# Patient Record
Sex: Female | Born: 1935 | Race: White | Hispanic: No | Smoking: Former smoker
Health system: Southern US, Community
[De-identification: ages and names within clinical notes are randomized; demographics above are authoritative.]

## PROBLEM LIST (undated history)

## (undated) DIAGNOSIS — N200 Calculus of kidney: Secondary | ICD-10-CM

## (undated) DIAGNOSIS — I10 Essential (primary) hypertension: Secondary | ICD-10-CM

## (undated) DIAGNOSIS — E039 Hypothyroidism, unspecified: Secondary | ICD-10-CM

## (undated) DIAGNOSIS — K859 Acute pancreatitis without necrosis or infection, unspecified: Secondary | ICD-10-CM

## (undated) DIAGNOSIS — I739 Peripheral vascular disease, unspecified: Secondary | ICD-10-CM

## (undated) DIAGNOSIS — I5030 Unspecified diastolic (congestive) heart failure: Secondary | ICD-10-CM

## (undated) DIAGNOSIS — I480 Paroxysmal atrial fibrillation: Secondary | ICD-10-CM

## (undated) DIAGNOSIS — M359 Systemic involvement of connective tissue, unspecified: Secondary | ICD-10-CM

## (undated) DIAGNOSIS — I639 Cerebral infarction, unspecified: Secondary | ICD-10-CM

## (undated) DIAGNOSIS — E119 Type 2 diabetes mellitus without complications: Secondary | ICD-10-CM

## (undated) DIAGNOSIS — I4892 Unspecified atrial flutter: Secondary | ICD-10-CM

## (undated) DIAGNOSIS — I219 Acute myocardial infarction, unspecified: Secondary | ICD-10-CM

## (undated) DIAGNOSIS — E7800 Pure hypercholesterolemia, unspecified: Secondary | ICD-10-CM

## (undated) HISTORY — DX: Unspecified atrial flutter: I48.92

## (undated) HISTORY — PX: APPENDECTOMY: SHX54

---

## 1997-05-28 HISTORY — PX: SPLENECTOMY: SUR1306

## 1997-05-28 HISTORY — PX: PARTIAL COLECTOMY: SHX5273

## 1997-05-28 HISTORY — PX: VASCULAR SURGERY: SHX849

## 2010-07-19 ENCOUNTER — Emergency Department (HOSPITAL_BASED_OUTPATIENT_CLINIC_OR_DEPARTMENT_OTHER): Payer: Medicare Other

## 2010-07-19 ENCOUNTER — Emergency Department (HOSPITAL_BASED_OUTPATIENT_CLINIC_OR_DEPARTMENT_OTHER)
Admission: EM | Admit: 2010-07-19 | Discharge: 2010-07-20 | Disposition: A | Discharge status: Home / Self Care | Payer: Medicare Other | Attending: Emergency Medicine | Admitting: Emergency Medicine

## 2010-07-19 DIAGNOSIS — E785 Hyperlipidemia, unspecified: Secondary | ICD-10-CM | POA: Insufficient documentation

## 2010-07-19 DIAGNOSIS — I10 Essential (primary) hypertension: Secondary | ICD-10-CM | POA: Insufficient documentation

## 2010-07-19 DIAGNOSIS — I4460 Unspecified fascicular block: Secondary | ICD-10-CM | POA: Insufficient documentation

## 2010-07-19 DIAGNOSIS — R109 Unspecified abdominal pain: Secondary | ICD-10-CM

## 2010-07-19 DIAGNOSIS — N200 Calculus of kidney: Secondary | ICD-10-CM | POA: Insufficient documentation

## 2010-07-19 DIAGNOSIS — I498 Other specified cardiac arrhythmias: Secondary | ICD-10-CM | POA: Insufficient documentation

## 2010-07-19 DIAGNOSIS — I700 Atherosclerosis of aorta: Secondary | ICD-10-CM | POA: Insufficient documentation

## 2010-07-19 LAB — COMPREHENSIVE METABOLIC PANEL
ALT: 16 U/L (ref 0–35)
AST: 29 U/L (ref 0–37)
Calcium: 10 mg/dL (ref 8.4–10.5)
Creatinine, Ser: 1 mg/dL (ref 0.4–1.2)
GFR calc Af Amer: >60 mL/min (ref >60)
Sodium: 147 mEq/L — ABNORMAL HIGH (ref 135–145)
Total Protein: 7.7 g/dL (ref 6.0–8.3)

## 2010-07-19 LAB — URINALYSIS, ROUTINE W REFLEX MICROSCOPIC
Nitrite: NEGATIVE
Protein, ur: NEGATIVE mg/dL
Specific Gravity, Urine: 1.005 (ref 1.005–1.030)
Urobilinogen, UA: 0.2 mg/dL (ref 0.0–1.0)

## 2010-07-19 LAB — DIFFERENTIAL
Basophils Absolute: 0 10*3/uL (ref 0.0–0.1)
Basophils Relative: 0 % (ref 0–1)
Eosinophils Absolute: 0.1 10*3/uL (ref 0.0–0.7)
Eosinophils Relative: 1 % (ref 0–5)
Lymphocytes Relative: 34 % (ref 12–46)

## 2010-07-19 LAB — CBC
Platelets: 184 10*3/uL (ref 150–400)
RDW: 14.2 % (ref 11.5–15.5)
WBC: 10.7 10*3/uL — ABNORMAL HIGH (ref 4.0–10.5)

## 2010-07-19 LAB — ETHANOL: Alcohol, Ethyl (B): 70 mg/dL — ABNORMAL HIGH (ref 0–10)

## 2010-07-20 MED ORDER — IOHEXOL 300 MG/ML  SOLN
100.0000 mL | Freq: Once | INTRAMUSCULAR | Status: AC | PRN
  Administered 2010-07-20: 100 mL via INTRAVENOUS

## 2010-07-21 ENCOUNTER — Emergency Department (INDEPENDENT_AMBULATORY_CARE_PROVIDER_SITE_OTHER): Payer: Medicare Other

## 2010-07-21 ENCOUNTER — Emergency Department (HOSPITAL_BASED_OUTPATIENT_CLINIC_OR_DEPARTMENT_OTHER)
Admission: EM | Admit: 2010-07-21 | Discharge: 2010-07-22 | Disposition: A | Discharge status: Home / Self Care | Payer: Medicare Other | Attending: Emergency Medicine | Admitting: Emergency Medicine

## 2010-07-21 DIAGNOSIS — R110 Nausea: Secondary | ICD-10-CM | POA: Insufficient documentation

## 2010-07-21 DIAGNOSIS — K117 Disturbances of salivary secretion: Secondary | ICD-10-CM | POA: Insufficient documentation

## 2010-07-21 DIAGNOSIS — K859 Acute pancreatitis without necrosis or infection, unspecified: Secondary | ICD-10-CM | POA: Insufficient documentation

## 2010-07-21 DIAGNOSIS — R109 Unspecified abdominal pain: Secondary | ICD-10-CM

## 2010-07-21 DIAGNOSIS — J984 Other disorders of lung: Secondary | ICD-10-CM

## 2010-07-21 DIAGNOSIS — I517 Cardiomegaly: Secondary | ICD-10-CM | POA: Insufficient documentation

## 2010-07-21 DIAGNOSIS — E785 Hyperlipidemia, unspecified: Secondary | ICD-10-CM | POA: Insufficient documentation

## 2010-07-21 DIAGNOSIS — I10 Essential (primary) hypertension: Secondary | ICD-10-CM | POA: Insufficient documentation

## 2010-07-21 LAB — CBC
HCT: 31 % — ABNORMAL LOW (ref 36.0–46.0)
Hemoglobin: 10.9 g/dL — ABNORMAL LOW (ref 12.0–15.0)
MCHC: 35.2 g/dL (ref 30.0–36.0)
RBC: 3.31 MIL/uL — ABNORMAL LOW (ref 3.87–5.11)

## 2010-07-21 LAB — COMPREHENSIVE METABOLIC PANEL
ALT: 23 U/L (ref 0–35)
AST: 29 U/L (ref 0–37)
Albumin: 4.2 g/dL (ref 3.5–5.2)
Alkaline Phosphatase: 82 U/L (ref 39–117)
CO2: 27 mEq/L (ref 19–32)
Chloride: 105 mEq/L (ref 96–112)
Creatinine, Ser: 1.1 mg/dL (ref 0.4–1.2)
GFR calc Af Amer: 58 mL/min — ABNORMAL LOW (ref >60)
GFR calc non Af Amer: 48 mL/min — ABNORMAL LOW (ref >60)
Potassium: 3.6 mEq/L (ref 3.5–5.1)
Sodium: 146 mEq/L — ABNORMAL HIGH (ref 135–145)
Total Bilirubin: 0.7 mg/dL (ref 0.3–1.2)

## 2010-07-21 LAB — DIFFERENTIAL
Basophils Absolute: 0 10*3/uL (ref 0.0–0.1)
Basophils Relative: 0 % (ref 0–1)
Lymphocytes Relative: 24 % (ref 12–46)
Monocytes Absolute: 0.8 10*3/uL (ref 0.1–1.0)
Neutro Abs: 7.7 10*3/uL (ref 1.7–7.7)
Neutrophils Relative %: 68 % (ref 43–77)

## 2010-07-21 LAB — LACTIC ACID, PLASMA: Lactic Acid, Venous: 1.3 mmol/L (ref 0.5–2.2)

## 2011-07-04 ENCOUNTER — Other Ambulatory Visit: Payer: Self-pay | Admitting: Physician Assistant

## 2011-10-08 ENCOUNTER — Other Ambulatory Visit: Payer: Self-pay | Admitting: Physician Assistant

## 2011-11-13 ENCOUNTER — Other Ambulatory Visit: Payer: Self-pay | Admitting: Physician Assistant

## 2012-02-09 ENCOUNTER — Other Ambulatory Visit: Payer: Self-pay | Admitting: Physician Assistant

## 2012-02-11 ENCOUNTER — Other Ambulatory Visit: Payer: Self-pay | Admitting: Physician Assistant

## 2012-02-11 NOTE — Telephone Encounter (Signed)
This is not a patient here at [facility]. °

## 2012-02-23 ENCOUNTER — Other Ambulatory Visit: Payer: Self-pay | Admitting: Physician Assistant

## 2012-02-23 NOTE — Telephone Encounter (Signed)
Please pull paper chart.  

## 2012-02-24 NOTE — Telephone Encounter (Signed)
No paper chart °

## 2012-03-08 ENCOUNTER — Other Ambulatory Visit: Payer: Self-pay | Admitting: Physician Assistant

## 2012-03-10 NOTE — Telephone Encounter (Signed)
This is not a patient here at [facility]. °

## 2012-05-02 ENCOUNTER — Other Ambulatory Visit: Payer: Self-pay | Admitting: Physician Assistant

## 2012-05-02 NOTE — Telephone Encounter (Signed)
Needs OV /labs

## 2013-02-02 ENCOUNTER — Emergency Department (HOSPITAL_BASED_OUTPATIENT_CLINIC_OR_DEPARTMENT_OTHER): Payer: Medicare Other

## 2013-02-02 ENCOUNTER — Other Ambulatory Visit (HOSPITAL_BASED_OUTPATIENT_CLINIC_OR_DEPARTMENT_OTHER): Payer: Medicare Other

## 2013-02-02 ENCOUNTER — Encounter (HOSPITAL_BASED_OUTPATIENT_CLINIC_OR_DEPARTMENT_OTHER): Payer: Self-pay

## 2013-02-02 ENCOUNTER — Inpatient Hospital Stay (HOSPITAL_BASED_OUTPATIENT_CLINIC_OR_DEPARTMENT_OTHER)
Admission: EM | Admit: 2013-02-02 | Discharge: 2013-02-06 | DRG: 308 | Disposition: A | Discharge status: Home / Self Care | Payer: Medicare Other | Attending: Internal Medicine | Admitting: Internal Medicine

## 2013-02-02 DIAGNOSIS — E119 Type 2 diabetes mellitus without complications: Secondary | ICD-10-CM | POA: Diagnosis present

## 2013-02-02 DIAGNOSIS — I4891 Unspecified atrial fibrillation: Secondary | ICD-10-CM

## 2013-02-02 DIAGNOSIS — I10 Essential (primary) hypertension: Secondary | ICD-10-CM | POA: Diagnosis present

## 2013-02-02 DIAGNOSIS — E7800 Pure hypercholesterolemia, unspecified: Secondary | ICD-10-CM

## 2013-02-02 DIAGNOSIS — I700 Atherosclerosis of aorta: Secondary | ICD-10-CM | POA: Diagnosis present

## 2013-02-02 DIAGNOSIS — E876 Hypokalemia: Secondary | ICD-10-CM | POA: Diagnosis not present

## 2013-02-02 DIAGNOSIS — I7090 Unspecified atherosclerosis: Secondary | ICD-10-CM

## 2013-02-02 DIAGNOSIS — I509 Heart failure, unspecified: Secondary | ICD-10-CM | POA: Diagnosis not present

## 2013-02-02 DIAGNOSIS — I252 Old myocardial infarction: Secondary | ICD-10-CM

## 2013-02-02 DIAGNOSIS — Z7982 Long term (current) use of aspirin: Secondary | ICD-10-CM

## 2013-02-02 DIAGNOSIS — I5031 Acute diastolic (congestive) heart failure: Secondary | ICD-10-CM

## 2013-02-02 DIAGNOSIS — E785 Hyperlipidemia, unspecified: Secondary | ICD-10-CM | POA: Diagnosis present

## 2013-02-02 DIAGNOSIS — Z79899 Other long term (current) drug therapy: Secondary | ICD-10-CM

## 2013-02-02 DIAGNOSIS — I739 Peripheral vascular disease, unspecified: Secondary | ICD-10-CM | POA: Diagnosis present

## 2013-02-02 DIAGNOSIS — F17200 Nicotine dependence, unspecified, uncomplicated: Secondary | ICD-10-CM | POA: Diagnosis present

## 2013-02-02 HISTORY — DX: Acute pancreatitis without necrosis or infection, unspecified: K85.90

## 2013-02-02 HISTORY — DX: Systemic involvement of connective tissue, unspecified: M35.9

## 2013-02-02 HISTORY — DX: Paroxysmal atrial fibrillation: I48.0

## 2013-02-02 HISTORY — DX: Acute myocardial infarction, unspecified: I21.9

## 2013-02-02 HISTORY — DX: Type 2 diabetes mellitus without complications: E11.9

## 2013-02-02 HISTORY — DX: Pure hypercholesterolemia, unspecified: E78.00

## 2013-02-02 LAB — COMPREHENSIVE METABOLIC PANEL
ALT: 19 U/L (ref 0–35)
Alkaline Phosphatase: 90 U/L (ref 39–117)
CO2: 27 mEq/L (ref 19–32)
Chloride: 99 mEq/L (ref 96–112)
GFR calc Af Amer: 43 mL/min — ABNORMAL LOW (ref >90)
Glucose, Bld: 112 mg/dL — ABNORMAL HIGH (ref 70–99)
Potassium: 3.8 mEq/L (ref 3.5–5.1)
Sodium: 139 mEq/L (ref 135–145)
Total Protein: 7.1 g/dL (ref 6.0–8.3)

## 2013-02-02 LAB — CBC WITH DIFFERENTIAL/PLATELET
Eosinophils Absolute: 0.1 10*3/uL (ref 0.0–0.7)
Lymphocytes Relative: 30 % (ref 12–46)
Lymphs Abs: 2.8 10*3/uL (ref 0.7–4.0)
Neutro Abs: 5.6 10*3/uL (ref 1.7–7.7)
Neutrophils Relative %: 60 % (ref 43–77)
Platelets: 192 10*3/uL (ref 150–400)

## 2013-02-02 LAB — TSH: TSH: 2.435 u[IU]/mL (ref 0.350–4.500)

## 2013-02-02 LAB — TROPONIN I: Troponin I: <0.3 ng/mL (ref <0.30)

## 2013-02-02 MED ORDER — DILTIAZEM HCL 100 MG IV SOLR
5.0000 mg/h | INTRAVENOUS | Status: DC
  Administered 2013-02-02: 5 mg/h via INTRAVENOUS
  Administered 2013-02-02: 10 mg/h via INTRAVENOUS
  Administered 2013-02-03: 5 mg/h via INTRAVENOUS
  Administered 2013-02-04: 10 mg/h via INTRAVENOUS

## 2013-02-02 MED ORDER — SODIUM CHLORIDE 0.9 % IV SOLN
INTRAVENOUS | Status: AC
  Administered 2013-02-02 – 2013-02-03 (×2): via INTRAVENOUS

## 2013-02-02 MED ORDER — LEVOTHYROXINE SODIUM 88 MCG PO CAPS: 88.0000 ug | ORAL_CAPSULE | Freq: Every day | ORAL | Status: DC

## 2013-02-02 MED ORDER — SIMVASTATIN 40 MG PO TABS
40.0000 mg | ORAL_TABLET | Freq: Every day | ORAL | Status: DC
  Administered 2013-02-03: 40 mg via ORAL
  Filled 2013-02-02 (×2): qty 1

## 2013-02-02 MED ORDER — ONDANSETRON HCL 4 MG/2ML IJ SOLN: 4.0000 mg | Freq: Four times a day (QID) | INTRAMUSCULAR | Status: DC | PRN

## 2013-02-02 MED ORDER — ACETAMINOPHEN 325 MG PO TABS: 650.0000 mg | ORAL_TABLET | ORAL | Status: DC | PRN

## 2013-02-02 MED ORDER — ASPIRIN 81 MG PO CHEW
324.0000 mg | CHEWABLE_TABLET | ORAL | Status: AC
  Administered 2013-02-02: 243 mg via ORAL
  Filled 2013-02-02: qty 3

## 2013-02-02 MED ORDER — LEVOTHYROXINE SODIUM 88 MCG PO TABS
88.0000 ug | ORAL_TABLET | Freq: Every day | ORAL | Status: DC
  Administered 2013-02-03 – 2013-02-06 (×3): 88 ug via ORAL
  Filled 2013-02-02 (×5): qty 1

## 2013-02-02 MED ORDER — GABAPENTIN 100 MG PO CAPS
500.0000 mg | ORAL_CAPSULE | Freq: Every day | ORAL | Status: DC
  Administered 2013-02-02 – 2013-02-05 (×4): 500 mg via ORAL
  Filled 2013-02-02 (×5): qty 5

## 2013-02-02 MED ORDER — OMEGA-3 FATTY ACIDS 1000 MG PO CAPS: 1.0000 g | ORAL_CAPSULE | Freq: Every day | ORAL | Status: DC

## 2013-02-02 MED ORDER — TRAMADOL HCL 50 MG PO TABS
50.0000 mg | ORAL_TABLET | Freq: Four times a day (QID) | ORAL | Status: DC | PRN
  Administered 2013-02-03 – 2013-02-06 (×2): 50 mg via ORAL
  Filled 2013-02-02 (×2): qty 1

## 2013-02-02 MED ORDER — HEPARIN (PORCINE) IN NACL 100-0.45 UNIT/ML-% IJ SOLN
INTRAMUSCULAR | Status: AC
  Filled 2013-02-02: qty 250

## 2013-02-02 MED ORDER — DILTIAZEM HCL 100 MG IV SOLR
INTRAVENOUS | Status: AC
  Filled 2013-02-02: qty 100

## 2013-02-02 MED ORDER — MESALAMINE ER 0.375 G PO CP24
0.7500 g | ORAL_CAPSULE | Freq: Every day | ORAL | Status: DC
  Administered 2013-02-03 – 2013-02-06 (×3): 0.75 g via ORAL

## 2013-02-02 MED ORDER — HEPARIN (PORCINE) IN NACL 100-0.45 UNIT/ML-% IJ SOLN
950.0000 [IU]/h | INTRAMUSCULAR | Status: DC
  Administered 2013-02-02 (×2): 650 [IU]/h via INTRAVENOUS
  Administered 2013-02-04: 900 [IU]/h via INTRAVENOUS
  Administered 2013-02-06: 950 [IU]/h via INTRAVENOUS
  Filled 2013-02-02 (×7): qty 250

## 2013-02-02 MED ORDER — DILTIAZEM HCL 25 MG/5ML IV SOLN
INTRAVENOUS | Status: AC
  Filled 2013-02-02: qty 5

## 2013-02-02 MED ORDER — SODIUM CHLORIDE 0.9 % IV SOLN: INTRAVENOUS | Status: AC

## 2013-02-02 MED ORDER — ISOSORBIDE MONONITRATE ER 60 MG PO TB24
60.0000 mg | ORAL_TABLET | Freq: Every day | ORAL | Status: DC
  Administered 2013-02-03: 60 mg via ORAL
  Filled 2013-02-02 (×2): qty 1

## 2013-02-02 MED ORDER — DIGOXIN 0.25 MG/ML IJ SOLN
0.5000 mg | Freq: Once | INTRAMUSCULAR | Status: AC
  Administered 2013-02-02: 0.5 mg via INTRAVENOUS
  Filled 2013-02-02: qty 2

## 2013-02-02 MED ORDER — HEPARIN (PORCINE) IN NACL 100-0.45 UNIT/ML-% IJ SOLN
12.0000 [IU]/kg/h | INTRAMUSCULAR | Status: DC
  Administered 2013-02-02: 12 [IU]/kg/h via INTRAVENOUS

## 2013-02-02 MED ORDER — NITROGLYCERIN 0.4 MG SL SUBL: 0.4000 mg | SUBLINGUAL_TABLET | SUBLINGUAL | Status: DC | PRN

## 2013-02-02 MED ORDER — ASPIRIN EC 81 MG PO TBEC
81.0000 mg | DELAYED_RELEASE_TABLET | Freq: Every day | ORAL | Status: DC
  Administered 2013-02-03 – 2013-02-06 (×3): 81 mg via ORAL
  Filled 2013-02-02 (×4): qty 1

## 2013-02-02 MED ORDER — OMEGA-3-ACID ETHYL ESTERS 1 G PO CAPS
1.0000 g | ORAL_CAPSULE | Freq: Every day | ORAL | Status: DC
  Administered 2013-02-03 – 2013-02-06 (×3): 1 g via ORAL
  Filled 2013-02-02 (×4): qty 1

## 2013-02-02 MED ORDER — ASPIRIN 300 MG RE SUPP
300.0000 mg | RECTAL | Status: AC
  Filled 2013-02-02: qty 1

## 2013-02-02 MED ORDER — DILTIAZEM LOAD VIA INFUSION
10.0000 mg | Freq: Once | INTRAVENOUS | Status: AC
  Administered 2013-02-02: 10 mg via INTRAVENOUS
  Filled 2013-02-02: qty 10

## 2013-02-02 MED ORDER — SODIUM CHLORIDE 0.9 % IV SOLN
INTRAVENOUS | Status: DC
  Administered 2013-02-02: 17:00:00 via INTRAVENOUS

## 2013-02-02 NOTE — ED Provider Notes (Signed)
CSN: [id number]     Arrival date & time 02/02/13  1601 History  This chart was scribed for Gerhard Munch, MD by Arlan Organ, ED Scribe and Greggory Stallion, ED Scribe. This patient was seen in room MH10/MH10 and the patient's care was started at 4:37 PM.    Chief Complaint  Patient presents with  . Shortness of Breath   The history is provided by the patient. No language interpreter was used.   HPI Comments: Salvatrice Morandi is a 79 y.o. female who presents to the Emergency Department complaining of SOB which started 2 days ago. Pt states she is having minor pain in her chest which only occurs after she ambulates. Pt denies confusion, disorientation, swellling, emesis, diarrhea. Pt had a heart attack 6-7 years ago and had vascular surgery in the late 1990's. She is currently on Plavix. Pt occasionally smokes cigarettes.   cardiologist is Dr. Harriette Bouillon PCP is Dr. Virl Son [facility].  Past Medical History  Diagnosis Date  . Pancreatitis   . Hypotension   . Myocardial infarction   . Thyroid disease   . High cholesterol    Past Surgical History  Procedure Laterality Date  . Vascular surgery    . Appendectomy     No family history on file. History  Substance Use Topics  . Smoking status: Current Some Day Smoker  . Smokeless tobacco: Not on file  . Alcohol Use: No   OB History   Grav Para Term Preterm Abortions TAB SAB Ect Mult Living                 Review of Systems  Constitutional:       Per HPI, otherwise negative  HENT:       Per HPI, otherwise negative  Respiratory:       Per HPI, otherwise negative  Cardiovascular:       Per HPI, otherwise negative  Gastrointestinal: Negative for vomiting.  Endocrine:       Negative aside from HPI  Genitourinary:       Neg aside from HPI   Musculoskeletal:       Per HPI, otherwise negative  Skin: Negative.   Neurological: Negative for syncope.  All other systems reviewed and are negative.    Allergies   Biaxin  Home Medications   Current Outpatient Rx  Name  Route  Sig  Dispense  Refill  . aspirin 81 MG tablet   Oral   Take 81 mg by mouth daily.         . Calcium Carbonate (CALCIUM 600 PO)   Oral   Take by mouth.         . carvedilol (COREG) 3.125 MG tablet   Oral   Take 3.125 mg by mouth 2 (two) times daily with a meal.         . Echinacea 400 MG CAPS   Oral   Take by mouth.         . fish oil-omega-3 fatty acids 1000 MG capsule   Oral   Take 1 g by mouth daily.         Marland Kitchen gabapentin (NEURONTIN) 300 MG capsule   Oral   Take 300 mg by mouth 3 (three) times daily.         . isosorbide mononitrate (IMDUR) 60 MG 24 hr tablet   Oral   Take 60 mg by mouth daily.         . Levothyroxine Sodium 88 MCG  CAPS   Oral   Take by mouth daily before breakfast.         . mesalamine (APRISO) 0.375 G 24 hr capsule   Oral   Take 375 mg by mouth daily.         . traMADol (ULTRAM) 50 MG tablet   Oral   Take 50 mg by mouth every 6 (six) hours as needed for pain.         . simvastatin (ZOCOR) 40 MG tablet      TAKE 1 TABLET DAILY   30 tablet   0     Needs office visit/labs!    BP 90/70  Pulse 146  Temp(Src) 98 F (36.7 C) (Oral)  Resp 12  SpO2 94% Physical Exam  Nursing note and vitals reviewed. Constitutional: She is oriented to person, place, and time. She appears well-developed and well-nourished. No distress.  HENT:  Head: Normocephalic and atraumatic.  Eyes: Conjunctivae and EOM are normal.  Cardiovascular:  Tachycardia and atrial fibrilation  Pulmonary/Chest: Effort normal and breath sounds normal. No stridor. No respiratory distress.  Abdominal: She exhibits no distension.  Musculoskeletal: She exhibits no edema.  Neurological: She is alert and oriented to person, place, and time. No cranial nerve deficit.  Skin: Skin is warm and dry.  Psychiatric: She has a normal mood and affect.    ED Course  Procedures (including critical care  time)  DIAGNOSTIC STUDIES: Oxygen Saturation is 94% on RA, Adequate by my interpretation.    COORDINATION OF CARE: 4:33 PM- Will admit pt to [facility] and will be prescribed medication for aFib. Pt agreed to plan.  Labs Review Labs Reviewed - No data to display Imaging Review No results found.  With the patient's irregular heart rhythm she received empiric therapy with fluids, diltiazem.  Update: Patient has a heart rate 149, tachycardic, abnormal   EKG has atrial fibrillation with rapid ventricular response, rate 128, with a bifascicular block, right bundle, left anterior block.  This is similar to the EKG she had performed prior to arrival. This is abnormal.    Update: Patient now has heart rate 130s, blood pressure decreased, with systolic, 90.   Update: I discussed the patient's case with our cardiologist, Dr. Tenny Craw.  Patient will receive digoxin, heparin. On repeat exam the patient appears in similar condition, with heart rate 120s, labile blood pressure. Patient and her son are aware of labs, transfer.    MDM  No diagnosis found.  I personally performed the services described in this documentation, which was scribed in my presence. The recorded information has been reviewed and is accurate.   This patient presents with dyspnea, mild chest pain.  On exam she is awake and alert, but she is immediately notable abnormal vital signs with heart rate greater than 140.  Patient has no history of prior dysrhythmia, though she does have known vasculopathy, takes Plavix. With her new atrial fibrillation with rapid ventricular response she received initial resuscitation with diltiazem.  Diltiazem therapy resulted in decreased heart rate, but blood pressure decreased as well.  After discussion with our cardiologist, the patient received digoxin, heparin. With her labile blood pressure, persistent tachycardia, she required transfer for further evaluation and management.  Patient will  go to step down unit.  CRITICAL CARE Performed by: Gerhard Munch Total critical care time: 40 Critical care time was exclusive of separately billable procedures and treating other patients. Critical care was necessary to treat or prevent imminent or life-threatening deterioration. Critical care  was time spent personally by me on the following activities: development of treatment plan with patient and/or surrogate as well as nursing, discussions with consultants, evaluation of patient's response to treatment, examination of patient, obtaining history from patient or surrogate, ordering and performing treatments and interventions, ordering and review of laboratory studies, ordering and review of radiographic studies, pulse oximetry and re-evaluation of patient's condition.   Gerhard Munch, MD 02/02/13 [phone]

## 2013-02-02 NOTE — Progress Notes (Signed)
ANTICOAGULATION CONSULT NOTE - Initial Consult  Pharmacy Consult for Heparin Indication: chest pain/ACS  Allergies  Allergen Reactions  . Biaxin [Clarithromycin] Rash    Patient Measurements: Weight: 109 lb (49.442 kg) Heparin Dosing Weight: 49  Vital Signs: Temp: 98.3 F (36.8 C) (09/08 1851) Temp src: Oral (09/08 1851) BP: 96/69 mmHg (09/08 1851) Pulse Rate: 127 (09/08 1851)  Labs:  Recent Labs  02/02/13 1636  HGB 10.7*  HCT 31.8*  PLT 192  LABPROT 14.6  INR 1.16  CREATININE 1.30*  TROPONINI <0.30    CrCl is unknown because there is no height on file for the current visit.   Medical History: Past Medical History  Diagnosis Date  . Pancreatitis   . Hypotension   . Myocardial infarction   . Thyroid disease   . High cholesterol     Medications:  Prescriptions prior to admission  Medication Sig Dispense Refill  . aspirin 81 MG tablet Take 81 mg by mouth daily.      . Calcium Carbonate (CALCIUM 600 PO) Take 1 tablet by mouth daily.       . carvedilol (COREG) 3.125 MG tablet Take 3.125 mg by mouth 2 (two) times daily with a meal.      . Echinacea 400 MG CAPS Take 1 capsule by mouth daily.       . fish oil-omega-3 fatty acids 1000 MG capsule Take 1 g by mouth daily.      Marland Kitchen gabapentin (NEURONTIN) 300 MG capsule Take 300 mg by mouth See admin instructions.       . isosorbide mononitrate (IMDUR) 60 MG 24 hr tablet Take 60 mg by mouth daily.      . Levothyroxine Sodium 88 MCG CAPS Take by mouth daily before breakfast.      . mesalamine (APRISO) 0.375 G 24 hr capsule Take 375 mg by mouth daily.      . simvastatin (ZOCOR) 40 MG tablet TAKE 1 TABLET DAILY  30 tablet  0  . traMADol (ULTRAM) 50 MG tablet Take 50 mg by mouth every 6 (six) hours as needed for pain.        Assessment: 79 yo M admitted 02/02/2013 with SOB.  Pharmacy consulted to dose heparin for new atrial fibrillation.  PMH: CAD, pancreatitis, hypothyroid  Goal of Therapy:  Heparin level 0.3-0.7  units/ml Monitor platelets by anticoagulation protocol: Yes   Plan:  Continue heparin at 650 units/hr, started at 1900 9.8 Check heparin level ~8h after ggt started, with AML Daily heparin level and CBC   Thank you for allowing pharmacy to be a part of this patients care team.  Lovenia Kim Pharm.D., BCPS Clinical Pharmacist 02/02/2013 9:08 PM Pager: [phone] Phone: [phone]

## 2013-02-02 NOTE — ED Notes (Addendum)
Was sent from [facility]CP[facility] with ?new onset Afib-pt c/o DOE and palpitations-Dr Leavy Cella called PTA reports pt with CP, DOE-EKG at office with AFIB, no old EKG and no mention in old cards reports of Afib-pt refused EMS transport-son to bring pt-pt into triage NAD with son-steady gait-no resp distress-denies pain at present

## 2013-02-02 NOTE — H&P (Signed)
Cardiology History and Physical  Verlon Au, MD  History of Present Illness (and review of medical records): Sydney Quinn is a 79 y.o. female who presents for evaluation of dyspnea on exertion.  She reports the development of dyspnea on exertion after awakening Saturday morning.  This lasted 3-4 mins and resolved with rest.  She would notice return of similar symptoms with mild exertion over the weekend which is usual for her.  She normal exercises 3-4x /week with no problems.  She then noticed her heart racing on Sunday.  She had one episode of 4-5/10 midsternal chest pain that resolved spontaneously.  She saw her PCP today and was found to be in AFib with RVR.  She was transferred for further evaluation and management.  Review of Systems Further review of systems was otherwise negative other than stated in HPI.  There are no active problems to display for this patient.  Past Medical History  Diagnosis Date  . Pancreatitis   . Hypotension   . Myocardial infarction   . Thyroid disease   . High cholesterol     Past Surgical History  Procedure Laterality Date  . Vascular surgery    . Appendectomy      Prescriptions prior to admission  Medication Sig Dispense Refill  . aspirin 81 MG tablet Take 81 mg by mouth daily.      . Calcium Carbonate (CALCIUM 600 PO) Take 1 tablet by mouth daily.       . carvedilol (COREG) 3.125 MG tablet Take 3.125 mg by mouth 2 (two) times daily with a meal.      . Echinacea 400 MG CAPS Take 1 capsule by mouth daily.       . fish oil-omega-3 fatty acids 1000 MG capsule Take 1 g by mouth daily.      Marland Kitchen gabapentin (NEURONTIN) 300 MG capsule Take 300 mg by mouth See admin instructions.       . isosorbide mononitrate (IMDUR) 60 MG 24 hr tablet Take 60 mg by mouth daily.      . Levothyroxine Sodium 88 MCG CAPS Take by mouth daily before breakfast.      . mesalamine (APRISO) 0.375 G 24 hr capsule Take 375 mg by mouth daily.      . simvastatin (ZOCOR)  40 MG tablet TAKE 1 TABLET DAILY  30 tablet  0  . traMADol (ULTRAM) 50 MG tablet Take 50 mg by mouth every 6 (six) hours as needed for pain.       Allergies  Allergen Reactions  . Biaxin [Clarithromycin] Rash    History  Substance Use Topics  . Smoking status: Current Some Day Smoker  . Smokeless tobacco: Not on file  . Alcohol Use: No    No family history on file.   Objective:  Patient Vitals for the past 8 hrs:  BP Temp Temp src Pulse Resp SpO2 Weight  02/02/13 1942 - - - - - - 49.442 kg (109 lb)  02/02/13 1851 96/69 mmHg 98.3 F (36.8 C) Oral 127 18 93 % -  02/02/13 1743 102/66 mmHg - - 130 - - -  02/02/13 1722 92/77 mmHg - - 129 - - -  02/02/13 1655 107/80 mmHg - - 144 14 96 % -  02/02/13 1615 90/70 mmHg 98 F (36.7 C) Oral 146 12 94 % -   General appearance: alert, cooperative, appears stated age and no distress Head: Normocephalic, without obvious abnormality, atraumatic Eyes: conjunctivae/corneas clear. PERRL, EOM's intact. Fundi benign.  Neck: bilateral carotid bruits, no JVD and supple Lungs: clear to auscultation bilaterally Chest wall: no tenderness Heart: irregular rate and rhythm, S1, S2 normal Abdomen: soft, non-tender; bowel sounds normal; no masses,  no organomegaly Extremities: extremities normal, atraumatic, no cyanosis or edema Pulses: 2+ and symmetric Neurologic: Grossly normal  Results for orders placed during the hospital encounter of 02/02/13 (from the past 48 hour(s))  TROPONIN I     Status: None   Collection Time    02/02/13  4:36 PM      Result Value Range   Troponin I <0.30  <0.30 ng/mL   Comment:            Due to the release kinetics of cTnI,     a negative result within the first hours     of the onset of symptoms does not rule out     myocardial infarction with certainty.     If myocardial infarction is still suspected,     repeat the test at appropriate intervals.  CBC WITH DIFFERENTIAL     Status: Abnormal   Collection Time     02/02/13  4:36 PM      Result Value Range   WBC 9.3  4.0 - 10.5 K/uL   RBC 3.33 (*) 3.87 - 5.11 MIL/uL   Hemoglobin 10.7 (*) 12.0 - 15.0 g/dL   HCT 81.1 (*) 91.4 - 78.2 %   MCV 95.5  78.0 - 100.0 fL   MCH 32.1  26.0 - 34.0 pg   MCHC 33.6  30.0 - 36.0 g/dL   RDW 95.6  21.3 - 08.6 %   Platelets 192  150 - 400 K/uL   Neutrophils Relative % 60  43 - 77 %   Neutro Abs 5.6  1.7 - 7.7 K/uL   Lymphocytes Relative 30  12 - 46 %   Lymphs Abs 2.8  0.7 - 4.0 K/uL   Monocytes Relative 8  3 - 12 %   Monocytes Absolute 0.8  0.1 - 1.0 K/uL   Eosinophils Relative 2  0 - 5 %   Eosinophils Absolute 0.1  0.0 - 0.7 K/uL   Basophils Relative 0  0 - 1 %   Basophils Absolute 0.0  0.0 - 0.1 K/uL  COMPREHENSIVE METABOLIC PANEL     Status: Abnormal   Collection Time    02/02/13  4:36 PM      Result Value Range   Sodium 139  135 - 145 mEq/L   Potassium 3.8  3.5 - 5.1 mEq/L   Chloride 99  96 - 112 mEq/L   CO2 27  19 - 32 mEq/L   Glucose, Bld 112 (*) 70 - 99 mg/dL   BUN 24 (*) 6 - 23 mg/dL   Creatinine, Ser 5.78 (*) 0.50 - 1.10 mg/dL   Calcium 46.9  8.4 - 62.9 mg/dL   Total Protein 7.1  6.0 - 8.3 g/dL   Albumin 3.3 (*) 3.5 - 5.2 g/dL   AST 26  0 - 37 U/L   ALT 19  0 - 35 U/L   Alkaline Phosphatase 90  39 - 117 U/L   Total Bilirubin 0.6  0.3 - 1.2 mg/dL   GFR calc non Af Amer 37 (*) >90 mL/min   GFR calc Af Amer 43 (*) >90 mL/min   Comment: (NOTE)     The eGFR has been calculated using the CKD EPI equation.     This calculation has not been validated in all clinical  situations.     eGFR's persistently <90 mL/min signify possible Chronic Kidney     Disease.  PROTIME-INR     Status: None   Collection Time    02/02/13  4:36 PM      Result Value Range   Prothrombin Time 14.6  11.6 - 15.2 seconds   INR 1.16  0.00 - 1.49  MAGNESIUM     Status: None   Collection Time    02/02/13  4:36 PM      Result Value Range   Magnesium 2.0  1.5 - 2.5 mg/dL   Dg Chest Port 1 View  02/02/2013   *RADIOLOGY  REPORT*  Clinical Data: Atrial fibrillation.  PORTABLE CHEST - 1 VIEW  Comparison: None.  Findings: Single view of the chest demonstrates enlargement of the cardiac silhouette.  Prominent lung markings, particularly at the lung bases.  Trachea is midline.  Bony thorax is intact.  Negative for a pneumothorax.  IMPRESSION: Cardiomegaly and evidence for interstitial pulmonary edema.   Original Report Authenticated By: Richarda Overlie, M.D.    ECG: HR 128  Atrial fibrillation with RVR, RBBB, no prior to compare  Assessment: Atrial fibrillation, new onset with RVR HTN HLD DM-diet controlled  PVD  Plan:  1. Cardiology  Admission  2. Continuous monitoring on Telemetry. 3. Repeat ekg on admit, prn chest pain or arrythmia 4. Trend cardiac biomarkers r/o, check lipids, hgba1c, tsh 5. Medical management to include Heparin gtt, dilt gtt 6. Will keep NPO for TEE/ DCCV if does not convert to NSR. 7. Will need to discuss outpatient regimen for anticoagulation.  Patient on ASA/Plavix for PVD.  She is hesitant regarding coumadin, but would like to try one of newer agents. CHADSVASc score 6.

## 2013-02-02 NOTE — ED Notes (Signed)
MD at bedside. 

## 2013-02-02 NOTE — ED Notes (Signed)
Attempt to call report to [facility] unit nurse not available

## 2013-02-03 DIAGNOSIS — I369 Nonrheumatic tricuspid valve disorder, unspecified: Secondary | ICD-10-CM

## 2013-02-03 DIAGNOSIS — I4891 Unspecified atrial fibrillation: Principal | ICD-10-CM

## 2013-02-03 LAB — LIPID PANEL
HDL: 43 mg/dL (ref >39)
LDL Cholesterol: 62 mg/dL (ref 0–99)
Total CHOL/HDL Ratio: 2.7 RATIO
Triglycerides: 48 mg/dL (ref <150)
VLDL: 10 mg/dL (ref 0–40)

## 2013-02-03 LAB — BASIC METABOLIC PANEL
BUN: 18 mg/dL (ref 6–23)
CO2: 26 mEq/L (ref 19–32)
Chloride: 106 mEq/L (ref 96–112)
GFR calc non Af Amer: 48 mL/min — ABNORMAL LOW (ref >90)
Glucose, Bld: 113 mg/dL — ABNORMAL HIGH (ref 70–99)
Potassium: 3.5 mEq/L (ref 3.5–5.1)

## 2013-02-03 LAB — CBC
HCT: 29.5 % — ABNORMAL LOW (ref 36.0–46.0)
Hemoglobin: 9.7 g/dL — ABNORMAL LOW (ref 12.0–15.0)
MCV: 94.6 fL (ref 78.0–100.0)
RDW: 14.8 % (ref 11.5–15.5)
WBC: 8 10*3/uL (ref 4.0–10.5)

## 2013-02-03 LAB — HEPARIN LEVEL (UNFRACTIONATED): Heparin Unfractionated: <0.1 IU/mL — ABNORMAL LOW (ref 0.30–0.70)

## 2013-02-03 MED ORDER — SODIUM CHLORIDE 0.9 % IV SOLN: INTRAVENOUS | Status: DC

## 2013-02-03 MED ORDER — SODIUM CHLORIDE 0.9 % IV BOLUS (SEPSIS)
500.0000 mL | Freq: Once | INTRAVENOUS | Status: AC
  Administered 2013-02-03: 500 mL via INTRAVENOUS

## 2013-02-03 MED ORDER — HEPARIN BOLUS VIA INFUSION
1500.0000 [IU] | Freq: Once | INTRAVENOUS | Status: AC
  Administered 2013-02-03: 1500 [IU] via INTRAVENOUS
  Filled 2013-02-03: qty 1500

## 2013-02-03 NOTE — Progress Notes (Signed)
ANTICOAGULATION CONSULT NOTE - Follow Up Consult  Pharmacy Consult for Heparin Indication: atrial fibrillation  Allergies  Allergen Reactions  . Biaxin [Clarithromycin] Rash    Patient Measurements: Height: 5\' 4"  (162.6 cm) Weight: 108 lb 14.5 oz (49.4 kg) IBW/kg (Calculated) : 54.7 Heparin Dosing Weight: 49 kg  Vital Signs: Temp: 98.8 F (37.1 C) (09/09 1200) Temp src: Oral (09/09 1200) BP: 98/72 mmHg (09/09 1130)  Labs:  Recent Labs  02/02/13 1636 02/02/13 2120 02/03/13 0510 02/03/13 1455  HGB 10.7*  --  9.7*  --   HCT 31.8*  --  29.5*  --   PLT 192  --  182  --   LABPROT 14.6  --   --   --   INR 1.16  --   --   --   HEPARINUNFRC  --   --  <0.10* 0.40  CREATININE 1.30*  --  1.05  --   TROPONINI <0.30 <0.30 <0.30  --     Estimated Creatinine Clearance: 32.8 ml/min (by C-G formula based on Cr of 1.05).   Assessment: DOE  79 yo M admitted 02/02/2013 with SOB. Pharmacy consulted to dose heparin for new atrial fibrillation.  PMH: CAD, pancreatitis, hypothyroid  Anticoagulation: New afib on heparin. Heparin now in goal range at 0.4  Goal of Therapy:  Heparin level 0.3-0.7 units/ml Monitor platelets by anticoagulation protocol: Yes   Plan:  Continue heparin at 900 units/hr and check HL in am.  Erickson Yamashiro S. Merilynn Finland, PharmD, BCPS Clinical Staff Pharmacist Pager [phone]  [name] 02/03/2013,3:25 PM

## 2013-02-03 NOTE — Progress Notes (Signed)
ANTICOAGULATION CONSULT NOTE  Pharmacy Consult for Heparin Indication: chest pain/ACS  Allergies  Allergen Reactions  . Biaxin [Clarithromycin] Rash    Patient Measurements: Height: 5\' 4"  (162.6 cm) Weight: 108 lb 14.5 oz (49.4 kg) IBW/kg (Calculated) : 54.7 Heparin Dosing Weight: 49  Vital Signs: Temp: 98.1 F (36.7 C) (09/09 0000) Temp src: Oral (09/09 0000) BP: 96/74 mmHg (09/09 0601) Pulse Rate: 76 (09/08 2230)  Labs:  Recent Labs  02/02/13 1636 02/02/13 2120 02/03/13 0510  HGB 10.7*  --  9.7*  HCT 31.8*  --  29.5*  PLT 192  --  182  LABPROT 14.6  --   --   INR 1.16  --   --   HEPARINUNFRC  --   --  <0.10*  CREATININE 1.30*  --  1.05  TROPONINI <0.30 <0.30 <0.30    Estimated Creatinine Clearance: 32.8 ml/min (by C-G formula based on Cr of 1.05).  Assessment: 79 yo  Female with Afib for heparin  Goal of Therapy:  Heparin level 0.3-0.7 units/ml Monitor platelets by anticoagulation protocol: Yes   Plan:  Heparin 1500 units IV bolus, then increase heparin 900 units/hr Check heparin level in 8 hours.   Geannie Risen, PharmD, BCPS

## 2013-02-03 NOTE — Progress Notes (Signed)
Patient Name: Sydney Quinn Date of Encounter: 02/03/2013     Active Problems:   * No active hospital problems. *    SUBJECTIVE  The patient denies any symptoms  Past Medical History   Diagnosis  Date   .  Pancreatitis    .  Hypotension    .  Myocardial infarction    .  Thyroid disease    .  High cholesterol      CURRENT MEDS . aspirin EC  81 mg Oral Daily  . gabapentin  500 mg Oral QHS  . isosorbide mononitrate  60 mg Oral Daily  . levothyroxine  88 mcg Oral QAC breakfast  . mesalamine  0.75 g Oral Daily  . omega-3 acid ethyl esters  1 g Oral Daily  . simvastatin  40 mg Oral q1800    OBJECTIVE  Filed Vitals:   02/03/13 0400 02/03/13 0406 02/03/13 0557 02/03/13 0601  BP: 71/53 74/42 100/82 96/74  Pulse:      Temp:      TempSrc:      Resp: 10 22 19 12   Height:      Weight:      SpO2: 95% 95% 92% 95%    Intake/Output Summary (Last 24 hours) at 02/03/13 0806 Last data filed at 02/03/13 0600  Gross per 24 hour  Intake 537.17 ml  Output    550 ml  Net -12.83 ml   Filed Weights   02/02/13 1942 02/02/13 2045  Weight: 109 lb (49.442 kg) 108 lb 14.5 oz (49.4 kg)    PHYSICAL EXAM  General: Pleasant, NAD. Neuro: Alert and oriented X 3. Moves all extremities spontaneously. Psych: Normal affect. HEENT:  Normal  Neck: Supple without bruits or JVD. Lungs:  Resp regular and unlabored, CTA. Heart: irregular, rapid, short systolic murmur, no s3, s4,  Abdomen: Soft, non-tender, non-distended, BS + x 4.  Extremities: No clubbing, cyanosis or edema. DP/PT/Radials 2+ and equal bilaterally.  Accessory Clinical Findings  CBC  Recent Labs  02/02/13 1636 02/03/13 0510  WBC 9.3 8.0  NEUTROABS 5.6  --   HGB 10.7* 9.7*  HCT 31.8* 29.5*  MCV 95.5 94.6  PLT 192 182   Basic Metabolic Panel  Recent Labs  02/02/13 1636 02/03/13 0510  NA 139 141  K 3.8 3.5  CL 99 106  CO2 27 26  GLUCOSE 112* 113*  BUN 24* 18  CREATININE 1.30* 1.05  CALCIUM 10.0 8.8    MG 2.0  --    Liver Function Tests  Recent Labs  02/02/13 1636  AST 26  ALT 19  ALKPHOS 90  BILITOT 0.6  PROT 7.1  ALBUMIN 3.3*   No results found for this basename: LIPASE, AMYLASE,  in the last 72 hours Cardiac Enzymes  Recent Labs  02/02/13 1636 02/02/13 2120 02/03/13 0510  TROPONINI <0.30 <0.30 <0.30   BNP No components found with this basename: POCBNP,  D-Dimer No results found for this basename: DDIMER,  in the last 72 hours Hemoglobin A1C No results found for this basename: HGBA1C,  in the last 72 hours Fasting Lipid Panel  Recent Labs  02/03/13 0510  CHOL 115  HDL 43  LDLCALC 62  TRIG 48  CHOLHDL 2.7   Thyroid Function Tests  Recent Labs  02/02/13 2120  TSH 2.244    TELE  A fib with RVR and pauses, longest 1.4 seconds  ECG  A fib with RVR, RBBB, LAD, STD in V4-6  Radiology/Studies  Dg Chest [facility]  1 View  02/02/2013   *RADIOLOGY REPORT*  Clinical Data: Atrial fibrillation.  PORTABLE CHEST - 1 VIEW  Comparison: None.  Findings: Single view of the chest demonstrates enlargement of the cardiac silhouette.  Prominent lung markings, particularly at the lung bases.  Trachea is midline.  Bony thorax is intact.  Negative for a pneumothorax.  IMPRESSION: Cardiomegaly and evidence for interstitial pulmonary edema.   Original Report Authenticated By: Richarda Overlie, M.D.    ASSESSMENT AND PLAN  Sydney Quinn is a 79 y.o. female who presents for evaluation of dyspnea on exertion. She reports the development of dyspnea on exertion after awakening Saturday morning. This lasted 3-4 mins and resolved with rest. She would notice return of similar symptoms with mild exertion over the weekend which is usual for her. She then noticed her heart racing on Sunday. She had one episode of 4-5/10 midsternal chest pain that resolved spontaneously. She saw her PCP today and was found to be in AFib with RVR. She was transferred for further evaluation and management.  -  continue iv Heparin - continue iv Diltiazem - await TTE - if no spontaneous cardioversion we will schedule for TEE/CV tomorrow   Signed, Lars Masson MD

## 2013-02-03 NOTE — Progress Notes (Signed)
  Echocardiogram 2D Echocardiogram has been performed.  Teryl Mcconaghy 02/03/2013, 10:33 AM 

## 2013-02-03 NOTE — Progress Notes (Signed)
Utilization Review Completed.  

## 2013-02-04 ENCOUNTER — Encounter (HOSPITAL_COMMUNITY): Payer: Self-pay | Admitting: Gastroenterology

## 2013-02-04 ENCOUNTER — Inpatient Hospital Stay (HOSPITAL_COMMUNITY): Payer: Medicare Other

## 2013-02-04 ENCOUNTER — Encounter (HOSPITAL_COMMUNITY): Payer: Medicare Other | Admitting: Anesthesiology

## 2013-02-04 ENCOUNTER — Inpatient Hospital Stay (HOSPITAL_COMMUNITY): Payer: Medicare Other | Admitting: Anesthesiology

## 2013-02-04 ENCOUNTER — Encounter (HOSPITAL_COMMUNITY)
Admission: EM | Disposition: A | Discharge status: Home / Self Care | Payer: Self-pay | Source: Home / Self Care | Attending: Internal Medicine

## 2013-02-04 DIAGNOSIS — I4891 Unspecified atrial fibrillation: Secondary | ICD-10-CM

## 2013-02-04 HISTORY — PX: CARDIOVERSION: SHX1299

## 2013-02-04 HISTORY — PX: TEE WITHOUT CARDIOVERSION: SHX5443

## 2013-02-04 LAB — CBC
HCT: 33.1 % — ABNORMAL LOW (ref 36.0–46.0)
MCH: 31.4 pg (ref 26.0–34.0)
MCHC: 33.2 g/dL (ref 30.0–36.0)
MCV: 94.6 fL (ref 78.0–100.0)
RDW: 14.9 % (ref 11.5–15.5)

## 2013-02-04 LAB — BLOOD GAS, ARTERIAL
Acid-Base Excess: 1.5 mmol/L (ref 0.0–2.0)
Bicarbonate: 26 mEq/L — ABNORMAL HIGH (ref 20.0–24.0)
O2 Saturation: 89.9 %
Patient temperature: 98.6
TCO2: 27.4 mmol/L (ref 0–100)
pO2, Arterial: 62.3 mmHg — ABNORMAL LOW (ref 80.0–100.0)

## 2013-02-04 LAB — HEPARIN LEVEL (UNFRACTIONATED): Heparin Unfractionated: 0.4 IU/mL (ref 0.30–0.70)

## 2013-02-04 SURGERY — ECHOCARDIOGRAM, TRANSESOPHAGEAL
Anesthesia: Moderate Sedation

## 2013-02-04 MED ORDER — WARFARIN VIDEO
Freq: Once | Status: AC
  Administered 2013-02-04: 14:00:00

## 2013-02-04 MED ORDER — PROPOFOL 10 MG/ML IV BOLUS
INTRAVENOUS | Status: DC | PRN
  Administered 2013-02-04: 20 mg via INTRAVENOUS

## 2013-02-04 MED ORDER — FUROSEMIDE 10 MG/ML IJ SOLN
40.0000 mg | Freq: Once | INTRAMUSCULAR | Status: AC
  Administered 2013-02-04: 40 mg via INTRAVENOUS
  Filled 2013-02-04: qty 4

## 2013-02-04 MED ORDER — ATORVASTATIN CALCIUM 80 MG PO TABS
80.0000 mg | ORAL_TABLET | Freq: Every day | ORAL | Status: DC
  Administered 2013-02-04 – 2013-02-05 (×2): 80 mg via ORAL
  Filled 2013-02-04 (×3): qty 1

## 2013-02-04 MED ORDER — DIPHENHYDRAMINE HCL 50 MG/ML IJ SOLN
INTRAMUSCULAR | Status: AC
  Filled 2013-02-04: qty 1

## 2013-02-04 MED ORDER — ISOSORBIDE MONONITRATE ER 30 MG PO TB24
30.0000 mg | ORAL_TABLET | Freq: Every day | ORAL | Status: DC
  Administered 2013-02-05: 30 mg via ORAL
  Filled 2013-02-04 (×2): qty 1

## 2013-02-04 MED ORDER — INFLUENZA VAC SPLIT QUAD 0.5 ML IM SUSP
0.5000 mL | INTRAMUSCULAR | Status: AC
  Filled 2013-02-04: qty 0.5

## 2013-02-04 MED ORDER — WARFARIN SODIUM 5 MG PO TABS
5.0000 mg | ORAL_TABLET | Freq: Once | ORAL | Status: AC
  Administered 2013-02-04: 5 mg via ORAL
  Filled 2013-02-04: qty 1

## 2013-02-04 MED ORDER — AMIODARONE HCL 200 MG PO TABS
400.0000 mg | ORAL_TABLET | Freq: Two times a day (BID) | ORAL | Status: DC
  Administered 2013-02-04 – 2013-02-06 (×5): 400 mg via ORAL
  Filled 2013-02-04 (×6): qty 2

## 2013-02-04 MED ORDER — MIDAZOLAM HCL 5 MG/ML IJ SOLN
INTRAMUSCULAR | Status: AC
  Filled 2013-02-04: qty 2

## 2013-02-04 MED ORDER — LIDOCAINE HCL (CARDIAC) 20 MG/ML IV SOLN
INTRAVENOUS | Status: DC | PRN
  Administered 2013-02-04: 10 mg via INTRAVENOUS

## 2013-02-04 MED ORDER — LACTATED RINGERS IV SOLN
INTRAVENOUS | Status: DC | PRN
  Administered 2013-02-04: 12:00:00 via INTRAVENOUS

## 2013-02-04 MED ORDER — FENTANYL CITRATE 0.05 MG/ML IJ SOLN
INTRAMUSCULAR | Status: DC | PRN
  Administered 2013-02-04 (×2): 12.5 ug via INTRAVENOUS

## 2013-02-04 MED ORDER — FENTANYL CITRATE 0.05 MG/ML IJ SOLN
INTRAMUSCULAR | Status: AC
  Filled 2013-02-04: qty 2

## 2013-02-04 MED ORDER — BUTAMBEN-TETRACAINE-BENZOCAINE 2-2-14 % EX AERO
INHALATION_SPRAY | CUTANEOUS | Status: DC | PRN
  Administered 2013-02-04 (×2): 2 via TOPICAL

## 2013-02-04 MED ORDER — SODIUM CHLORIDE 0.9 % IV SOLN: INTRAVENOUS | Status: DC

## 2013-02-04 MED ORDER — COUMADIN BOOK
Freq: Once | Status: AC
  Administered 2013-02-04: 14:00:00
  Filled 2013-02-04: qty 1

## 2013-02-04 MED ORDER — WARFARIN - PHARMACIST DOSING INPATIENT
Freq: Every day | Status: DC
  Administered 2013-02-04: 18:00:00

## 2013-02-04 MED ORDER — MIDAZOLAM HCL 10 MG/2ML IJ SOLN
INTRAMUSCULAR | Status: DC | PRN
  Administered 2013-02-04 (×3): 1 mg via INTRAVENOUS

## 2013-02-04 NOTE — Progress Notes (Signed)
  Echocardiogram Echocardiogram Transesophageal has been performed.  Sydney Quinn 02/04/2013, 12:11 PM

## 2013-02-04 NOTE — Progress Notes (Signed)
ANTICOAGULATION CONSULT NOTE - Follow Up Consult  Pharmacy Consult for Heparin/Coumadin Indication: atrial fibrillation  Allergies  Allergen Reactions  . Biaxin [Clarithromycin] Rash    Patient Measurements: Height: 5\' 4"  (162.6 cm) Weight: 108 lb 14.5 oz (49.4 kg) IBW/kg (Calculated) : 54.7 Heparin Dosing Weight: 49 kg   Vital Signs: Temp: 98.8 F (37.1 C) (09/10 1030) Temp src: Oral (09/10 1030) BP: 102/60 mmHg (09/10 1225) Pulse Rate: 82 (09/10 1225)  Labs:  Recent Labs  02/02/13 1636 02/02/13 2120 02/03/13 0510 02/03/13 1455 02/04/13 0435  HGB 10.7*  --  9.7*  --  11.0*  HCT 31.8*  --  29.5*  --  33.1*  PLT 192  --  182  --  221  LABPROT 14.6  --   --   --   --   INR 1.16  --   --   --   --   HEPARINUNFRC  --   --  <0.10* 0.40 0.40  CREATININE 1.30*  --  1.05  --   --   TROPONINI <0.30 <0.30 <0.30 <0.30  --     Estimated Creatinine Clearance: 32.8 ml/min (by C-G formula based on Cr of 1.05).   Assessment: DOE  79 yo M admitted 02/02/2013 with SOB. Pharmacy consulted to dose heparin for new atrial fibrillation.  PMH: CAD, pancreatitis, hypothyroid  Anticoagulation: New afib on heparin. Heparin level 0.4 in goal. Baseline INR 1.16. Hgb improved to 11. CV this am. Plan to start Coumadin tonight  Cardiovascular: CAD, HLD, new afib. VSS on  ASA 81mg , Imdur, Lovaza, po amiodarone  Endocrinology: thyroid dz on Sythroid  Gastrointestinal / Nutrition: mesalamine  Neurology: Neurontin  Nephrology: Scr 1.05 down  Pulmonary  Hematology / Oncology  PTA Medication Issues  Best Practices  Plan:  Goal of Therapy:  Heparin level 0.3-0.7 units/ml INR 2-3 Monitor platelets by anticoagulation protocol: Yes   Plan:  F/u Echo results Continue heparin at 900 units/hr Coumadin 5mg  po x 1 tonight. Daily INR  Renly Roots S. Merilynn Finland, PharmD, BCPS Clinical Staff Pharmacist Pager [phone]  [name] 02/04/2013,1:12 PM

## 2013-02-04 NOTE — Transfer of Care (Signed)
Immediate Anesthesia Transfer of Care Note  Patient: Sydney Quinn  Procedure(s) Performed: Procedure(s): TRANSESOPHAGEAL ECHOCARDIOGRAM (TEE) (N/A) CARDIOVERSION (N/A)  Patient Location: Short Stay and Endoscopy Unit  Anesthesia Type:MAC  Level of Consciousness: awake  Airway & Oxygen Therapy: Patient Spontanous Breathing and Patient connected to nasal cannula oxygen  Post-op Assessment: Report given to PACU RN, Post -op Vital signs reviewed and stable and Patient moving all extremities X 4  Post vital signs: Reviewed and stable  Complications: No apparent anesthesia complications

## 2013-02-04 NOTE — Preoperative (Signed)
Beta Blockers   Reason not to administer Beta Blockers:Not Applicable 

## 2013-02-04 NOTE — Anesthesia Preprocedure Evaluation (Addendum)
Anesthesia Evaluation  Patient identified by MRN, date of birth, ID band Patient awake    Reviewed: Allergy & Precautions, H&P , NPO status , Patient's Chart, lab work & pertinent test results, reviewed documented beta blocker date and time   Airway Mallampati: I TM Distance: >3 FB     Dental  (+) Dental Advidsory Given   Pulmonary          Cardiovascular + Past MI and +CHF + dysrhythmias Atrial Fibrillation Rhythm:irregular     Neuro/Psych    GI/Hepatic   Endo/Other    Renal/GU      Musculoskeletal   Abdominal   Peds  Hematology   Anesthesia Other Findings   Reproductive/Obstetrics                          Anesthesia Physical Anesthesia Plan  ASA: III  Anesthesia Plan: General   Post-op Pain Management:    Induction: Intravenous  Airway Management Planned: Mask  Additional Equipment:   Intra-op Plan:   Post-operative Plan:   Informed Consent:   Dental Advisory Given  Plan Discussed with: Anesthesiologist, CRNA and Surgeon  Anesthesia Plan Comments:        Anesthesia Quick Evaluation

## 2013-02-04 NOTE — Progress Notes (Signed)
SBP mid 80's, patient on cardizem drip at 10 mg/hr, Dr Delton See called with order to stopped the cardizem at this time will continue to monitor patienrt-. Done ep

## 2013-02-04 NOTE — H&P (View-Only) (Signed)
Patient Name: Sydney Quinn Date of Encounter: 02/04/2013     Active Problems:   * No active hospital problems. *    SUBJECTIVE  The patient feels SOB and anxious about being in the hospital.   CURRENT MEDS . aspirin EC  81 mg Oral Daily  . gabapentin  500 mg Oral QHS  . [START ON 02/05/2013] influenza vac split quadrivalent PF  0.5 mL Intramuscular Tomorrow-1000  . isosorbide mononitrate  60 mg Oral Daily  . levothyroxine  88 mcg Oral QAC breakfast  . mesalamine  0.75 g Oral Daily  . omega-3 acid ethyl esters  1 g Oral Daily  . simvastatin  40 mg Oral q1800    OBJECTIVE  Filed Vitals:   02/03/13 2045 02/04/13 0018 02/04/13 0400 02/04/13 0700  BP: 113/77 117/73 116/50 91/65  Pulse:      Temp: 98.1 F (36.7 C) 98.3 F (36.8 C) 98.1 F (36.7 C) 98.3 F (36.8 C)  TempSrc: Oral Oral Oral Oral  Resp: 18 18 23    Height:      Weight:      SpO2: 99% 92% 96% 96%    Intake/Output Summary (Last 24 hours) at 02/04/13 0913 Last data filed at 02/04/13 0600  Gross per 24 hour  Intake    509 ml  Output    250 ml  Net    259 ml   Filed Weights   02/02/13 1942 02/02/13 2045  Weight: 109 lb (49.442 kg) 108 lb 14.5 oz (49.4 kg)    PHYSICAL EXAM  General: Pleasant, NAD. Neuro: Alert and oriented X 3. Moves all extremities spontaneously. Psych: Normal affect. HEENT:  Normal  Neck: Supple without bruits or JVD. Lungs:  Resp regular and unlabored, CTA. Heart: RRR no s3, s4, or murmurs. Abdomen: Soft, non-tender, non-distended, BS + x 4.  Extremities: No clubbing, cyanosis or edema. DP/PT/Radials 2+ and equal bilaterally.  Accessory Clinical Findings  CBC  Recent Labs  02/02/13 1636 02/03/13 0510 02/04/13 0435  WBC 9.3 8.0 11.7*  NEUTROABS 5.6  --   --   HGB 10.7* 9.7* 11.0*  HCT 31.8* 29.5* 33.1*  MCV 95.5 94.6 94.6  PLT 192 182 221   Basic Metabolic Panel  Recent Labs  02/02/13 1636 02/03/13 0510  NA 139 141  K 3.8 3.5  CL 99 106  CO2 27 26    GLUCOSE 112* 113*  BUN 24* 18  CREATININE 1.30* 1.05  CALCIUM 10.0 8.8  MG 2.0  --    Liver Function Tests  Recent Labs  02/02/13 1636  AST 26  ALT 19  ALKPHOS 90  BILITOT 0.6  PROT 7.1  ALBUMIN 3.3*   No results found for this basename: LIPASE, AMYLASE,  in the last 72 hours Cardiac Enzymes  Recent Labs  02/02/13 2120 02/03/13 0510 02/03/13 1455  TROPONINI <0.30 <0.30 <0.30   BNP No components found with this basename: POCBNP,  D-Dimer No results found for this basename: DDIMER,  in the last 72 hours Hemoglobin A1C No results found for this basename: HGBA1C,  in the last 72 hours Fasting Lipid Panel  Recent Labs  02/03/13 0510  CHOL 115  HDL 43  LDLCALC 62  TRIG 48  CHOLHDL 2.7   Thyroid Function Tests  Recent Labs  02/02/13 2120  TSH 2.244    TELE  A fib with RVR  Radiology/Studies  Dg Chest Port 1 View  02/04/2013   *RADIOLOGY REPORT*  Clinical Data: Shortness of breath  PORTABLE CHEST - 1 VIEW  Comparison: 02/02/2013  Findings: Cardiac enlargement.  Since the previous study, there is progression of pulmonary vascular congestion with increasing edema, increasing small bilateral pleural effusions, and developing basilar atelectasis.  Calcified and tortuous aorta.  No pneumothorax.  Degenerative changes in the spine.  Vascular calcifications.  IMPRESSION: Cardiac enlargement with progression of pulmonary vascular congestion, pulmonary edema, and bilateral pleural effusions with basilar atelectasis.   Original Report Authenticated By: Burman Nieves, M.D.   Dg Chest Port 1 View  02/02/2013   *RADIOLOGY REPORT*  Clinical Data: Atrial fibrillation.  PORTABLE CHEST - 1 VIEW  Comparison: None.  Findings: Single view of the chest demonstrates enlargement of the cardiac silhouette.  Prominent lung markings, particularly at the lung bases.  Trachea is midline.  Bony thorax is intact.  Negative for a pneumothorax.  IMPRESSION: Cardiomegaly and evidence for  interstitial pulmonary edema.   Original Report Authenticated By: Richarda Overlie, M.D.    ASSESSMENT AND PLAN  Sydney Quinn is a 79 y.o. female who presents for evaluation of dyspnea on exertion. She reports the development of dyspnea on exertion after awakening Saturday morning. She was found to be in a-fib with RVR that persisted despite 2 days of Cardizem drip. The patient is symptomatic. He echo shows severe LVH, good LV function, mild left atrial enlargement and severe pulmonary hypertension.   - NPO for TEE with cardioversion today - start Coumadin  - continue heparin drip for now   Signed,  Lars Masson MD

## 2013-02-04 NOTE — Progress Notes (Addendum)
Patient Name: Sydney Quinn Date of Encounter: 02/04/2013     Active Problems:   * No active hospital problems. *    SUBJECTIVE  The patient feels SOB and anxious about being in the hospital.   CURRENT MEDS . aspirin EC  81 mg Oral Daily  . gabapentin  500 mg Oral QHS  . [START ON 02/05/2013] influenza vac split quadrivalent PF  0.5 mL Intramuscular Tomorrow-1000  . isosorbide mononitrate  60 mg Oral Daily  . levothyroxine  88 mcg Oral QAC breakfast  . mesalamine  0.75 g Oral Daily  . omega-3 acid ethyl esters  1 g Oral Daily  . simvastatin  40 mg Oral q1800    OBJECTIVE  Filed Vitals:   02/03/13 2045 02/04/13 0018 02/04/13 0400 02/04/13 0700  BP: 113/77 117/73 116/50 91/65  Pulse:      Temp: 98.1 F (36.7 C) 98.3 F (36.8 C) 98.1 F (36.7 C) 98.3 F (36.8 C)  TempSrc: Oral Oral Oral Oral  Resp: 18 18 23    Height:      Weight:      SpO2: 99% 92% 96% 96%    Intake/Output Summary (Last 24 hours) at 02/04/13 0913 Last data filed at 02/04/13 0600  Gross per 24 hour  Intake    509 ml  Output    250 ml  Net    259 ml   Filed Weights   02/02/13 1942 02/02/13 2045  Weight: 109 lb (49.442 kg) 108 lb 14.5 oz (49.4 kg)    PHYSICAL EXAM  General: Pleasant, NAD. Neuro: Alert and oriented X 3. Moves all extremities spontaneously. Psych: Normal affect. HEENT:  Normal  Neck: Supple without bruits or JVD. Lungs:  Resp regular and unlabored, CTA. Heart: RRR no s3, s4, or murmurs. Abdomen: Soft, non-tender, non-distended, BS + x 4.  Extremities: No clubbing, cyanosis or edema. DP/PT/Radials 2+ and equal bilaterally.  Accessory Clinical Findings  CBC  Recent Labs  02/02/13 1636 02/03/13 0510 02/04/13 0435  WBC 9.3 8.0 11.7*  NEUTROABS 5.6  --   --   HGB 10.7* 9.7* 11.0*  HCT 31.8* 29.5* 33.1*  MCV 95.5 94.6 94.6  PLT 192 182 221   Basic Metabolic Panel  Recent Labs  02/02/13 1636 02/03/13 0510  NA 139 141  K 3.8 3.5  CL 99 106  CO2 27 26    GLUCOSE 112* 113*  BUN 24* 18  CREATININE 1.30* 1.05  CALCIUM 10.0 8.8  MG 2.0  --    Liver Function Tests  Recent Labs  02/02/13 1636  AST 26  ALT 19  ALKPHOS 90  BILITOT 0.6  PROT 7.1  ALBUMIN 3.3*   No results found for this basename: LIPASE, AMYLASE,  in the last 72 hours Cardiac Enzymes  Recent Labs  02/02/13 2120 02/03/13 0510 02/03/13 1455  TROPONINI <0.30 <0.30 <0.30   BNP No components found with this basename: POCBNP,  D-Dimer No results found for this basename: DDIMER,  in the last 72 hours Hemoglobin A1C No results found for this basename: HGBA1C,  in the last 72 hours Fasting Lipid Panel  Recent Labs  02/03/13 0510  CHOL 115  HDL 43  LDLCALC 62  TRIG 48  CHOLHDL 2.7   Thyroid Function Tests  Recent Labs  02/02/13 2120  TSH 2.244    TELE  A fib with RVR  Radiology/Studies  Dg Chest Port 1 View  02/04/2013   *RADIOLOGY REPORT*  Clinical Data: Shortness of breath  PORTABLE CHEST - 1 VIEW  Comparison: 02/02/2013  Findings: Cardiac enlargement.  Since the previous study, there is progression of pulmonary vascular congestion with increasing edema, increasing small bilateral pleural effusions, and developing basilar atelectasis.  Calcified and tortuous aorta.  No pneumothorax.  Degenerative changes in the spine.  Vascular calcifications.  IMPRESSION: Cardiac enlargement with progression of pulmonary vascular congestion, pulmonary edema, and bilateral pleural effusions with basilar atelectasis.   Original Report Authenticated By: Burman Nieves, M.D.   Dg Chest Port 1 View  02/02/2013   *RADIOLOGY REPORT*  Clinical Data: Atrial fibrillation.  PORTABLE CHEST - 1 VIEW  Comparison: None.  Findings: Single view of the chest demonstrates enlargement of the cardiac silhouette.  Prominent lung markings, particularly at the lung bases.  Trachea is midline.  Bony thorax is intact.  Negative for a pneumothorax.  IMPRESSION: Cardiomegaly and evidence for  interstitial pulmonary edema.   Original Report Authenticated By: Richarda Overlie, M.D.    ASSESSMENT AND PLAN  Sydney Quinn is a 79 y.o. female who presents for evaluation of dyspnea on exertion. She reports the development of dyspnea on exertion after awakening Saturday morning. She was found to be in a-fib with RVR that persisted despite 2 days of Cardizem drip. The patient is symptomatic. He echo shows severe LVH, good LV function, mild left atrial enlargement and severe pulmonary hypertension.   - NPO for TEE with cardioversion today - start Coumadin  - continue heparin drip for now  Signed,  Tobias Alexander, H MD  This an addendum to my prior note to clarify patient's diagnosis. The patient has a chronic heart failure with preserved ejection fraction, previously known as diastolic heart failure. It is supported by echocardiographic signs of diastolic dysfunction and severe left ventricular hypertrophy. The most probable cause is hypertension and age. She has now been admitted with acute on chronic heart failure secondary to atrial fibrillation with rapid ventricular response which caused decompansation of her chronic heart failure.

## 2013-02-04 NOTE — CV Procedure (Signed)
    Transesophageal Echocardiogram Note  Sydney Quinn [id number] December 02, 1929  Procedure: Transesophageal Echocardiogram Indications: Atrial fibrillation with rapid ventricular response  Procedure Details Consent: Obtained Time Out: Verified patient identification, verified procedure, site/side was marked, verified correct patient position, special equipment/implants available, Radiology Safety Procedures followed,  medications/allergies/relevent history reviewed, required imaging and test results available.  Performed  Medications: Fentanyl: 25 mcg Versed: 3 mg  Left Ventrical: LV EF 65-70%, severe concentric LVH  Mitral Valve: severe MAC, thickened mitral valve leaflets with restricted opening  Aortic Valve: tricuspid, mild AI  Tricuspid Valve: moderate TR  Pulmonic Valve: normal  Left Atrium/ Left atrial appendage: no thrombus, decreased filling and emptying velocities  Atrial septum: lipomatous hyperthrophy of the interatrial septum, no ASD  Aorta: ascending aorta is not dilated, no obvious plaque. In the descending portion of the aortic arch there is a large, highly mobile atherosclerotic plaque, in the remaining visualized portion of the thoracic aorta there is diffuse severe atherosclerotic plaque with another mobile portion at 35 cm. No obvious ulcer seen.    Theses findings were discussed with patient's son. We will continue iv Heparin and start Coumadine and dose Lipitor. We will also start loading with Amiodarone PO.    Complications: No apparent complications Patient did tolerate procedure well.   Tobias Alexander, MD, [facility] 02/04/2013, 11:36 AM      Cardioversion Note  Sydney Quinn [id number] [birth year]  Procedure: DC Cardioversion Indications: A fib with RVR  Procedure Details Consent: Obtained Time Out: Verified patient identification, verified procedure, site/side was marked, verified correct patient position, special equipment/implants available,  Radiology Safety Procedures followed,  medications/allergies/relevent history reviewed, required imaging and test results available.  Performed  The patient has been on adequate anticoagulation.  The patient received 20 mg of Propofol IV for sedation.  Synchronous cardioversion was performed at 120 x 1  joules.  The cardioversion was successful with restoration of SR.     Complications:  Patient did tolerate procedure well.   Tobias Alexander, MD, [facility] 02/04/2013, 11:44 AM

## 2013-02-04 NOTE — Anesthesia Postprocedure Evaluation (Signed)
  Anesthesia Post-op Note  Patient: Sydney Quinn  Procedure(s) Performed: Procedure(s): TRANSESOPHAGEAL ECHOCARDIOGRAM (TEE) (N/A) CARDIOVERSION (N/A)  Patient Location: PACU and Endoscopy Unit  Anesthesia Type:MAC  Level of Consciousness: awake  Airway and Oxygen Therapy: Patient Spontanous Breathing and Patient connected to nasal cannula oxygen  Post-op Pain: none  Post-op Assessment: Post-op Vital signs reviewed, Patient's Cardiovascular Status Stable, Respiratory Function Stable, Patent Airway and No signs of Nausea or vomiting  Post-op Vital Signs: Reviewed and stable  Complications: No apparent anesthesia complications

## 2013-02-04 NOTE — Interval H&P Note (Signed)
History and Physical Interval Note:  02/04/2013 11:02 AM  Sydney Quinn  has presented today for surgery, with the diagnosis of a fib  The various methods of treatment have been discussed with the patient and family. After consideration of risks, benefits and other options for treatment, the patient has consented to  Procedure(s): TRANSESOPHAGEAL ECHOCARDIOGRAM (TEE) (N/A) CARDIOVERSION (N/A) as a surgical intervention .  The patient's history has been reviewed, patient examined, no change in status, stable for surgery.  I have reviewed the patient's chart and labs.  Questions were answered to the patient's satisfaction.     Tobias Alexander, H

## 2013-02-04 NOTE — Progress Notes (Signed)
Pt complains of SOB. Assessed pt wheezing heard. Notified MD. CXR ordered and other orders placed. Will continue to monitor.

## 2013-02-05 ENCOUNTER — Inpatient Hospital Stay (HOSPITAL_COMMUNITY): Payer: Medicare Other

## 2013-02-05 ENCOUNTER — Encounter (HOSPITAL_COMMUNITY): Payer: Self-pay | Admitting: Cardiovascular Disease

## 2013-02-05 DIAGNOSIS — I5031 Acute diastolic (congestive) heart failure: Secondary | ICD-10-CM

## 2013-02-05 DIAGNOSIS — I7090 Unspecified atherosclerosis: Secondary | ICD-10-CM

## 2013-02-05 LAB — HEPARIN LEVEL (UNFRACTIONATED): Heparin Unfractionated: 0.33 IU/mL (ref 0.30–0.70)

## 2013-02-05 LAB — PROTIME-INR
INR: 1.15 (ref 0.00–1.49)
Prothrombin Time: 14.5 seconds (ref 11.6–15.2)

## 2013-02-05 MED ORDER — WARFARIN SODIUM 5 MG PO TABS
5.0000 mg | ORAL_TABLET | Freq: Once | ORAL | Status: AC
  Administered 2013-02-05: 5 mg via ORAL
  Filled 2013-02-05: qty 1

## 2013-02-05 MED ORDER — IOHEXOL 350 MG/ML SOLN
100.0000 mL | Freq: Once | INTRAVENOUS | Status: AC | PRN
  Administered 2013-02-05: 80 mL via INTRAVENOUS

## 2013-02-05 NOTE — Progress Notes (Signed)
Pt's son, Freida Busman, states their cousin's wife, who is an LPN, would be able to work out a schedule to administer Lovenox shots twice a day. Also, son would like to be called and informed of mother's discharge or plan of care on 02/06/13, if not at bedside.

## 2013-02-05 NOTE — Progress Notes (Signed)
Patient Name: Sydney Quinn Date of Encounter: 02/05/2013    Active Problems:   * No active hospital problems. *    SUBJECTIVE  The patient feels much better  CURRENT MEDS . amiodarone  400 mg Oral BID  . aspirin EC  81 mg Oral Daily  . atorvastatin  80 mg Oral q1800  . gabapentin  500 mg Oral QHS  . influenza vac split quadrivalent PF  0.5 mL Intramuscular Tomorrow-1000  . isosorbide mononitrate  30 mg Oral Daily  . levothyroxine  88 mcg Oral QAC breakfast  . mesalamine  0.75 g Oral Daily  . omega-3 acid ethyl esters  1 g Oral Daily  . warfarin  5 mg Oral ONCE-1800  . Warfarin - Pharmacist Dosing Inpatient   Does not apply q1800    OBJECTIVE  Filed Vitals:   02/05/13 1100 02/05/13 1126 02/05/13 1200 02/05/13 1400  BP:  85/56    Pulse:      Temp:  99 F (37.2 C)    TempSrc:  Oral    Resp: 20  20 24   Height:      Weight:      SpO2:  94%      Intake/Output Summary (Last 24 hours) at 02/05/13 1453 Last data filed at 02/05/13 1300  Gross per 24 hour  Intake    777 ml  Output      0 ml  Net    777 ml   Filed Weights   02/02/13 1942 02/02/13 2045  Weight: 109 lb (49.442 kg) 108 lb 14.5 oz (49.4 kg)    PHYSICAL EXAM  General: Pleasant, NAD. Neuro: Alert and oriented X 3. Moves all extremities spontaneously. Psych: Normal affect. HEENT:  Normal  Neck: Supple without bruits or JVD. Lungs:  Resp regular and unlabored, crackles in both lung bases. Heart: RRR no s3, s4, or murmurs. Abdomen: Soft, non-tender, non-distended, BS + x 4.  Extremities: No clubbing, cyanosis or edema. DP/PT/Radials 2+ and equal bilaterally.  Accessory Clinical Findings  CBC  Recent Labs  02/02/13 1636 02/03/13 0510 02/04/13 0435  WBC 9.3 8.0 11.7*  NEUTROABS 5.6  --   --   HGB 10.7* 9.7* 11.0*  HCT 31.8* 29.5* 33.1*  MCV 95.5 94.6 94.6  PLT 192 182 221   Basic Metabolic Panel  Recent Labs  02/02/13 1636 02/03/13 0510  NA 139 141  K 3.8 3.5  CL 99 106  CO2 27  26  GLUCOSE 112* 113*  BUN 24* 18  CREATININE 1.30* 1.05  CALCIUM 10.0 8.8  MG 2.0  --    Liver Function Tests  Recent Labs  02/02/13 1636  AST 26  ALT 19  ALKPHOS 90  BILITOT 0.6  PROT 7.1  ALBUMIN 3.3*   No results found for this basename: LIPASE, AMYLASE,  in the last 72 hours Cardiac Enzymes  Recent Labs  02/02/13 2120 02/03/13 0510 02/03/13 1455  TROPONINI <0.30 <0.30 <0.30    Recent Labs  02/03/13 0510  CHOL 115  HDL 43  LDLCALC 62  TRIG 48  CHOLHDL 2.7   Thyroid Function Tests  Recent Labs  02/02/13 2120  TSH 2.244    TELE  SR, HR 60-70  Radiology/Studies  Dg Chest Port 1 View  02/04/2013   *RADIOLOGY REPORT*  Clinical Data: Shortness of breath  PORTABLE CHEST - 1 VIEW  Comparison: 02/02/2013  Findings: Cardiac enlargement.  Since the previous study, there is progression of pulmonary vascular congestion with increasing edema, increasing  small bilateral pleural effusions, and developing basilar atelectasis.  Calcified and tortuous aorta.  No pneumothorax.  Degenerative changes in the spine.  Vascular calcifications.  IMPRESSION: Cardiac enlargement with progression of pulmonary vascular congestion, pulmonary edema, and bilateral pleural effusions with basilar atelectasis.   Original Report Authenticated By: Burman Nieves, M.D.   Ct Angio Chest Aortic Dissect W &/or W/o  02/05/2013   *RADIOLOGY REPORT*  Clinical Data:  Extensive atherosclerotic plaquing in the descending aortic arch by transesophageal echo concerning for ulcerated plaque formation or dissection.  History of mesenteric bypass  CT ANGIOGRAPHY CHEST, ABDOMEN AND PELVIS  Technique:  Multidetector CT imaging through the chest, abdomen and pelvis was performed using the standard protocol during bolus administration of intravenous contrast.  Multiplanar reconstructed images including MIPs were obtained and reviewed to evaluate the vascular anatomy.  Contrast: 80mL OMNIPAQUE IOHEXOL 350 MG/ML  SOLN  Comparison:  09/26/2010, 07/20/2010  CTA CHEST  Findings:  Extensive heavy calcification of the major branch vessels and the entire thoracic aorta.  Negative for acute dissection.  Diffuse mural thickening of the descending aorta.  No large ulcerated plaque formation appreciated.  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  Brachiocephalic and left common carotid arteries are heavily calcified but patent.  No definite hyperdense intramural hemorrhage appreciated on the initial noncontrast phase of the chest.  Central pulmonary arteries are patent.  No large pulmonary embolus or filling defect.  Extensive mitral valve annular calcifications and native coronary calcifications.  Heart is enlarged.  No pericardial effusion.  Small dependent non loculated pleural effusions bilaterally.  Mild adenopathy in the chest with an AP window node measuring 10 mm short axis.  Precarinal lymph nodes measure 12 mm in short axis. These are likely reactive.  Lung windows demonstrate diffuse patchy ground-glass opacity throughout both lungs with interlobular septal thickening compatible with edema.  Patchy bibasilar atelectasis noted. Trachea and central airways appear patent.  No mucous plugging or bronchiectasis.  No definite focal pneumonia.  Degenerative changes noted of the thoracic spine.  No compression fracture.   Review of the MIP images confirms the above findings.  IMPRESSION: Extensive heavy calcific atherosclerosis of the thoracic aorta. Negative for dissection or acute vascular process.  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  Negative for significant acute pulmonary embolus  Cardiomegaly with diffuse mild edema and pleural effusions compatible with mild CHF  Bibasilar atelectasis  CTA ABDOMEN AND PELVIS  Findings:  Upper abdominal aorta demonstrates a bypass graft which is patent from the aorta to the celiac and superior mesenteric arteries.  Celiac and SMA branches are patent.  IMA remains patent  off the distal aorta.  Extensive heavy calcification of the abdominal aorta with marked luminal narrowing and calcified thrombus involving the suprarenal aorta and infrarenal aorta. Despite his significant aortic narrowing, the aorta remains patent. No definite acute dissection.  Negative for retroperitoneal hemorrhage or rupture.  Aortic bifurcation remains patent.  Common, internal, external iliac arteries in the pelvis are patent.  Common femoral, profunda femoral, proximal superficial femoral arteries into the lower extremities are patent.  No iliac inflow disease, dissection, aneurysm or occlusive process.  Exam of the abdominal vasculature is stable.  Nonvascular findings:  Liver, gallbladder, biliary system, pancreas, mildly thickened adrenal glands, and kidneys are within normal limits for age and demonstrate no acute process.  Renal atrophy of the right kidney lower pole with small cysts.  Negative for bowel obstruction, dilatation, ileus, or free air. Exam of bowel is limited without  oral contrast.  No abdominal or pelvic free fluid, fluid collection, hemorrhage, or abscess.  Urinary bladder unremarkable.  Uterus is atrophic.  Stool- filled bowel in the pelvis.  No inguinal abnormality or hernia.  Diffuse degenerative changes and osteopenia of the spine, pelvis and hips.   Review of the MIP images confirms the above findings.  IMPRESSION: Stable extensive calcific atherosclerosis of the native abdominal aorta with luminal narrowing of the aorta in the suprarenal and infrarenal areas.  No definite aortic occlusive process.  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  IMA also remains patent.  Stable patent iliac pelvic vasculature.  Negative for acute dissection or retroperitoneal hemorrhage.   Original Report Authenticated By: Judie Petit. Miles Costain, M.D.   Ct Angio Abd/pel W/ And/or W/o  02/05/2013   CTA CHEST:  IMPRESSION: Extensive heavy calcific atherosclerosis of the thoracic aorta. Negative for dissection  or acute vascular process.  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  Negative for significant acute pulmonary embolus  Cardiomegaly with diffuse mild edema and pleural effusions compatible with mild CHF  Bibasilar atelectasis   CTA ABDOMEN AND PELVIS:  IMPRESSION: Stable extensive calcific atherosclerosis of the native abdominal aorta with luminal narrowing of the aorta in the suprarenal and infrarenal areas.  No definite aortic occlusive process.  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  IMA also remains patent.  Stable patent iliac pelvic vasculature.  Negative for acute dissection or retroperitoneal hemorrhage.   Original Report Authenticated By: Judie Petit. Shick, M.D.   TTE: Left Ventrical: LV EF 65-70%, severe concentric LVH  Mitral Valve: severe MAC, thickened mitral valve leaflets with restricted opening  Aortic Valve: tricuspid, mild AI  Tricuspid Valve: moderate TR  Pulmonic Valve: normal  Left Atrium/ Left atrial appendage: no thrombus, decreased filling and emptying velocities  Atrial septum: lipomatous hyperthrophy of the interatrial septum, no ASD  Aorta: ascending aorta is not dilated, no obvious plaque. In the descending portion of the aortic arch there is a large, highly mobile atherosclerotic plaque, in the remaining visualized portion of the thoracic aorta there is diffuse severe atherosclerotic plaque with another mobile portion at 35 cm. No obvious ulcer seen.    ASSESSMENT AND PLAN   Emmalea Treanor is a 79 y.o. female who presents for evaluation of dyspnea on exertion. She reports the development of dyspnea on exertion after awakening Saturday morning. She was found to be in a-fib with RVR that persisted despite 2 days of Cardizem drip. The patient is symptomatic. He echo shows severe LVH, good LV function, mild left atrial enlargement and severe pulmonary hypertension.   1. A fib - cardioverted yesterday and remains in SR, started on Amiodarone load and Coumadine  -  still on Heparin drip   2. Severe intra-aortic atherosclerosis in the entire descending thoracic aorta, including 2 mobile atheromas, prior h/o abdominal aortic bypass - started on high dose Lipitor - continue anticoagulation - because of concern of possible ulcerated plaque CTA thorax was performed and showed  heavy calcific atherosclerosis of the thoracic aorta. Negative for dissection or acute vascular process.  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  CTA abdomen pelvis showed stable extensive calcific atherosclerosis of the native abdominal aorta with luminal narrowing of the aorta in the suprarenal and infrarenal areas.  No definite aortic occlusive process.   3. CHF - the patient still in pulmonary edema, we will be careful with use of diuretics as she just received iv contrast. We will follow tomorrow.  Signed,  Tobias Alexander, H MD 02/05/2013

## 2013-02-05 NOTE — Clinical Documentation Improvement (Signed)
THIS DOCUMENT IS NOT A PERMANENT PART OF THE MEDICAL RECORD  Please update your documentation with the medical record to reflect your response to this query. If you need help knowing how to do this please call [phone].  02/05/13  Dr. Delton See,  In a better effort to capture your patient's severity of illness, reflect appropriate length of stay and utilization of resources, a review of the patient medical record has revealed the following information:  Admitted with Atrial Fibrillation with RVR  "CHF" documented on the Anesthesia Preassessment prior to DCCV 02/04/13   Patient reporting "shortness of breath and wheezing" per Nurse's Note 02/04/13 at 0452  CXR 02/04/13   IMPRESSION:  Cardiac enlargement with progression of pulmonary vascular congestion, pulmonary edema, and bilateral pleural effusions with basilar atelectasis.  Echo 02/02/13 Study Conclusions - Left ventricle: The cavity size was mildly dilated. Wall thickness was increased in a pattern of severe LVH. Systolic function was normal. The estimated ejection fraction was in the range of 60% to 65%. - Aortic valve: Mild regurgitation. - Mitral valve: Severe MAC and thickening of mitral leaflets which are not well seen. Pt1/2 and gradients suggest mild MS Calcified annulus. Mild regurgitation. Valve area by pressure half-time: 1.73cm^2. - Left atrium: The atrium was mildly dilated. - Right atrium: The atrium was mildly dilated. - Atrial septum: No defect or patent foramen ovale was identified. - Tricuspid valve: Moderate regurgitation. - Pulmonary arteries: PA peak pressure: 68mm Hg (S).   Lasix 40mg  IV given x 1 at 0456 on 02/04/13    Based on your clinical judgment, please document the ACUITY and TYPE of Heart Failure treated with IV Lasix:  ACUITY:  - Acute  - Acute on Chronic  - Chronic  AND  TYPE:  - Systolic  - Diastolic  - Combined    -  - Please also include the cause of the Heart Failure in the  progress notes and discharge summary.   In responding to this query please exercise your independent judgment.     The fact that a query is asked, does not imply that any particular answer is desired or expected.   Reviewed: additional documentation in the medical record  Thank You,  Jerral Ralph  RN BSN CCDS Certified Clinical Documentation Specialist: [phone] Health Information Management [place]  I have added my comments to both documents that you have sent me. Tobias Alexander, MD

## 2013-02-05 NOTE — Progress Notes (Signed)
ANTICOAGULATION CONSULT NOTE - Follow Up Consult  Pharmacy Consult for Heparin/Coumadin Indication: atrial fibrillation  Allergies  Allergen Reactions  . Biaxin [Clarithromycin] Rash    Patient Measurements: Height: 5\' 4"  (162.6 cm) Weight: 108 lb 14.5 oz (49.4 kg) IBW/kg (Calculated) : 54.7 Heparin Dosing Weight: 49 kg   Vital Signs: Temp: 98.8 F (37.1 C) (09/11 0800) Temp src: Oral (09/11 0800) BP: 93/61 mmHg (09/11 0800)  Labs:  Recent Labs  02/02/13 1636 02/02/13 2120  02/03/13 0510 02/03/13 1455 02/04/13 0435 02/05/13 0755  HGB 10.7*  --   --  9.7*  --  11.0*  --   HCT 31.8*  --   --  29.5*  --  33.1*  --   PLT 192  --   --  182  --  221  --   LABPROT 14.6  --   --   --   --   --  14.5  INR 1.16  --   --   --   --   --  1.15  HEPARINUNFRC  --   --   < > <0.10* 0.40 0.40 0.33  CREATININE 1.30*  --   --  1.05  --   --   --   TROPONINI <0.30 <0.30  --  <0.30 <0.30  --   --   < > = values in this interval not displayed.  Estimated Creatinine Clearance: 32.8 ml/min (by C-G formula based on Cr of 1.05).   Assessment: DOE  79 yo M admitted 02/02/2013 with SOB. Pharmacy consulted to dose heparin for new atrial fibrillation.  PMH: CAD, pancreatitis, hypothyroid  Anticoagulation: New afib on heparin. Cardioverted 9/10. Heparin level 0.33 in goal. Baseline INR 1.16. INR 1.15 this am. Hgb improved to 11.   Cardiovascular: CAD, HLD, new afib. Echo with EF 60-65%. BP 93/61, HR 74-134 on  ASA 81mg , Imdur, Lovaza, po amiodarone started  Endocrinology: thyroid dz on Sythroid  Gastrointestinal / Nutrition: mesalamine  Neurology: Neurontin  Nephrology: Scr 1.05 down  Goal of Therapy:  Heparin level 0.3-0.7 units/ml INR 2-3 Monitor platelets by anticoagulation protocol: Yes   Plan:  Continue heparin at 900 units/hr Coumadin 5mg  po x 1 tonight. Daily INR, heparin level, CBC  Sydney Quinn S. Sydney Quinn, PharmD, BCPS Clinical Staff Pharmacist Pager  [phone]  Sydney Quinn 02/05/2013,11:08 AM

## 2013-02-05 NOTE — Progress Notes (Signed)
Asked to see pt re: possible d/c.  Chart reviewed.  Pt is s/p TEE and DCCV yesterday.  Her inr is subRx and she remains on heparin bridging.  We discussed the importance to steady anticoagulation following DCCV.  Her son was present in the room.  Pt does not think that she would be able to give herself lovenox inj @ home (lives by herself) but son says that his sister in law is an LPN and may be able to adjust her schedule to provide q12h inj.  Son is going to look into options at home and get back to [place] tomorrow.  In the meantime, I will leave her on heparin and coumadin.

## 2013-02-06 ENCOUNTER — Encounter (HOSPITAL_COMMUNITY): Payer: Self-pay | Admitting: Nurse Practitioner

## 2013-02-06 DIAGNOSIS — I5031 Acute diastolic (congestive) heart failure: Secondary | ICD-10-CM

## 2013-02-06 DIAGNOSIS — I4891 Unspecified atrial fibrillation: Secondary | ICD-10-CM

## 2013-02-06 DIAGNOSIS — E876 Hypokalemia: Secondary | ICD-10-CM

## 2013-02-06 DIAGNOSIS — E7800 Pure hypercholesterolemia, unspecified: Secondary | ICD-10-CM

## 2013-02-06 LAB — PROTIME-INR
INR: 1.29 (ref 0.00–1.49)
Prothrombin Time: 15.8 seconds — ABNORMAL HIGH (ref 11.6–15.2)

## 2013-02-06 LAB — BASIC METABOLIC PANEL
BUN: 9 mg/dL (ref 6–23)
CO2: 26 mEq/L (ref 19–32)
Calcium: 9.2 mg/dL (ref 8.4–10.5)
Chloride: 103 mEq/L (ref 96–112)
Creatinine, Ser: 0.91 mg/dL (ref 0.50–1.10)
GFR calc Af Amer: 67 mL/min — ABNORMAL LOW (ref >90)
GFR calc non Af Amer: 58 mL/min — ABNORMAL LOW (ref >90)
Glucose, Bld: 218 mg/dL — ABNORMAL HIGH (ref 70–99)
Potassium: 3.2 mEq/L — ABNORMAL LOW (ref 3.5–5.1)
Sodium: 142 mEq/L (ref 135–145)

## 2013-02-06 LAB — HEPARIN LEVEL (UNFRACTIONATED): Heparin Unfractionated: 0.26 IU/mL — ABNORMAL LOW (ref 0.30–0.70)

## 2013-02-06 MED ORDER — ENOXAPARIN SODIUM 60 MG/0.6ML ~~LOC~~ SOLN: 50.0000 mg | Freq: Two times a day (BID) | SUBCUTANEOUS | Status: DC

## 2013-02-06 MED ORDER — AMIODARONE HCL 200 MG PO TABS: ORAL_TABLET | ORAL | Status: DC

## 2013-02-06 MED ORDER — POTASSIUM CHLORIDE CRYS ER 20 MEQ PO TBCR
60.0000 meq | EXTENDED_RELEASE_TABLET | Freq: Once | ORAL | Status: AC
  Administered 2013-02-06: 60 meq via ORAL
  Filled 2013-02-06: qty 3

## 2013-02-06 MED ORDER — ENOXAPARIN (LOVENOX) PATIENT EDUCATION KIT
PACK | Freq: Once | Status: DC
  Filled 2013-02-06: qty 1

## 2013-02-06 MED ORDER — AMIODARONE HCL 400 MG PO TABS: ORAL_TABLET | ORAL | Status: DC

## 2013-02-06 MED ORDER — WARFARIN SODIUM 6 MG PO TABS
6.0000 mg | ORAL_TABLET | Freq: Once | ORAL | Status: DC
  Filled 2013-02-06: qty 1

## 2013-02-06 MED ORDER — ENOXAPARIN SODIUM 60 MG/0.6ML ~~LOC~~ SOLN
50.0000 mg | Freq: Two times a day (BID) | SUBCUTANEOUS | Status: DC
  Administered 2013-02-06: 50 mg via SUBCUTANEOUS
  Filled 2013-02-06 (×2): qty 0.6

## 2013-02-06 MED ORDER — WARFARIN SODIUM 5 MG PO TABS: 5.0000 mg | ORAL_TABLET | Freq: Once | ORAL | Status: DC

## 2013-02-06 NOTE — Progress Notes (Signed)
Patient Name: Sydney Quinn Date of Encounter: 02/06/2013  Principal Problem:   A-fib Active Problems:   Acute diastolic heart failure   High cholesterol   SUBJECTIVE  The patient feels well, SOB is significantly improved  CURRENT MEDS . amiodarone  400 mg Oral BID  . aspirin EC  81 mg Oral Daily  . atorvastatin  80 mg Oral q1800  . enoxaparin (LOVENOX) injection  50 mg Subcutaneous Q12H  . enoxaparin   Does not apply Once  . gabapentin  500 mg Oral QHS  . isosorbide mononitrate  30 mg Oral Daily  . levothyroxine  88 mcg Oral QAC breakfast  . mesalamine  0.75 g Oral Daily  . omega-3 acid ethyl esters  1 g Oral Daily  . warfarin  6 mg Oral ONCE-1800  . Warfarin - Pharmacist Dosing Inpatient   Does not apply q1800    OBJECTIVE  Filed Vitals:   02/06/13 0000 02/06/13 0400 02/06/13 0839 02/06/13 1124  BP: 134/90 114/75 89/53 82/52   Pulse:   76 75  Temp: 98.4 F (36.9 C) 98.6 F (37 C) 98.8 F (37.1 C) 98.6 F (37 C)  TempSrc: Oral Oral Oral Oral  Resp: 14 15    Height:      Weight:      SpO2: 95% 95% 95% 93%    Intake/Output Summary (Last 24 hours) at 02/06/13 1209 Last data filed at 02/06/13 1000  Gross per 24 hour  Intake 1020.11 ml  Output      0 ml  Net 1020.11 ml   Filed Weights   02/02/13 1942 02/02/13 2045  Weight: 109 lb (49.442 kg) 108 lb 14.5 oz (49.4 kg)    PHYSICAL EXAM  PHYSICAL EXAM  General: Pleasant, NAD.  Neuro: Alert and oriented X 3. Moves all extremities spontaneously.  Psych: Normal affect.  HEENT: Normal  Neck: Supple without bruits or JVD.  Lungs: Resp regular and unlabored, crackles in both lung bases.  Heart: RRR no s3, s4, or murmurs.  Abdomen: Soft, non-tender, non-distended, BS + x 4.  Extremities: No clubbing, cyanosis or edema. DP/PT/Radials 2+ and equal bilaterally.  Accessory Clinical Findings  CBC  Recent Labs  02/04/13 0435  WBC 11.7*  HGB 11.0*  HCT 33.1*  MCV 94.6  PLT 221   Basic Metabolic  Panel  Recent Labs  16/10/96 0915  NA 142  K 3.2*  CL 103  CO2 26  GLUCOSE 218*  BUN 9  CREATININE 0.91  CALCIUM 9.2   Cardiac Enzymes  Recent Labs  02/03/13 1455  TROPONINI <0.30   TELE  SR, HR 60-70  Radiology/Studies   Dg Chest Port 1 View  02/04/2013 *RADIOLOGY REPORT* Clinical Data: Shortness of breath PORTABLE CHEST - 1 VIEW Comparison: 02/02/2013 Findings: Cardiac enlargement. Since the previous study, there is progression of pulmonary vascular congestion with increasing edema, increasing small bilateral pleural effusions, and developing basilar atelectasis. Calcified and tortuous aorta. No pneumothorax. Degenerative changes in the spine. Vascular calcifications. IMPRESSION: Cardiac enlargement with progression of pulmonary vascular congestion, pulmonary edema, and bilateral pleural effusions with basilar atelectasis. Original Report Authenticated By: Burman Nieves, M.D.   Ct Angio Chest Aortic Dissect W &/or W/o  02/05/2013  IMPRESSION:IMPRESSION: Stable extensive calcific atherosclerosis of the native abdominal aorta with luminal narrowing of the aorta in the suprarenal and infrarenal areas. No definite aortic occlusive process. 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 IMA also remains patent. Stable patent iliac pelvic vasculature. Negative for acute dissection or  retroperitoneal hemorrhage. Original Report Authenticated By: Judie Petit. Shick, M.D.    TTE:  Left Ventrical: LV EF 65-70%, severe concentric LVH  Mitral Valve: severe MAC, thickened mitral valve leaflets with restricted opening  Aortic Valve: tricuspid, mild AI  Tricuspid Valve: moderate TR  Pulmonic Valve: normal  Left Atrium/ Left atrial appendage: no thrombus, decreased filling and emptying velocities  Atrial septum: lipomatous hyperthrophy of the interatrial septum, no ASD  Aorta: ascending aorta is not dilated, no obvious plaque. In the descending portion of the aortic arch there is a large, highly  mobile atherosclerotic plaque, in the remaining visualized portion of the thoracic aorta there is diffuse severe atherosclerotic plaque with another mobile portion at 35 cm. No obvious ulcer seen.    ASSESSMENT AND PLAN   Sydney Quinn is a pleasant 79 year old female who presented with symptomatic a-fib with RVR (first diagnosis) . The echo shows severe LVH, good LV function, mild left atrial enlargement and severe pulmonary hypertension.   1. A fib - cardioverted on 02/04/13 and remains in SR, started on Amiodarone load and Coumadine  - Start Lovenox bridge today - the patient can be discharged once once she has help at home for Lovenox shots or when her INR >2  2. Severe intra-aortic atherosclerosis in the entire descending thoracic aorta, including 2 mobile atheromas on TEE, prior h/o abdominal aortic bypass  - started on high dose Lipitor  - continue anticoagulation  - because of concern of possible ulcerated plaque CTA thorax was performed and showed heavy calcific atherosclerosis of the thoracic aorta. Negative for dissection or acute vascular process. 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 CTA abdomen pelvis showed stable extensive calcific atherosclerosis of the native abdominal aorta with luminal narrowing of the aorta in the suprarenal and infrarenal areas. No definite aortic occlusive process.   3. CHF - the patient still in mildly fluid overloaded, but significantly improved, we will be careful with use of diuretics as she just received iv contrast.   4. Hypokalemia - replace with KCL   Signed,  Tobias Alexander, H MD  02/05/2013

## 2013-02-06 NOTE — Progress Notes (Signed)
ANTICOAGULATION CONSULT NOTE - Follow Up Consult  Pharmacy Consult for Coumadin Indication: atrial fibrillation  Allergies  Allergen Reactions  . Biaxin [Clarithromycin] Rash    Patient Measurements: Height: 5\' 4"  (162.6 cm) Weight: 108 lb 14.5 oz (49.4 kg) IBW/kg (Calculated) : 54.7   Vital Signs: Temp: 98.8 F (37.1 C) (09/12 0839) Temp src: Oral (09/12 0839) BP: 89/53 mmHg (09/12 0839) Pulse Rate: 76 (09/12 0839)  Labs:  Recent Labs  02/03/13 1455 02/04/13 0435 02/05/13 0755 02/06/13 0353  HGB  --  11.0*  --   --   HCT  --  33.1*  --   --   PLT  --  221  --   --   LABPROT  --   --  14.5 15.8*  INR  --   --  1.15 1.29  HEPARINUNFRC 0.40 0.40 0.33 0.26*  TROPONINI <0.30  --   --   --     Estimated Creatinine Clearance: 32.8 ml/min (by C-G formula based on Cr of 1.05).   Assessment:  79 y.o. female admitted with new afib. S/p cardioversion 9/10. Heparin gtt changed to SQ Lovenox per MD 9/12 in anticipation of home soon. Baseline INR 1.16. INR 1.29 - not much movement past 2 doses of 5mg  (will increase cautiously with amio on board- which potentiates coumadin effects). Hgb 11. No bleeding noted.  Goal of Therapy:  Anti-Xa level 0.6 -1.2 (4 hrs post dose) INR 2-3 Monitor platelets by anticoagulation protocol: Yes   Plan:  1) Lovenox 50mg  SQ q12h (per MD) - first dose 1 hour post heparin gtt d/c. 2) Coumadin 6mg  po x 1 tonight.  3) Daily INR  Christoper Fabian, PharmD, BCPS Clinical pharmacist, pager [phone]  02/06/2013,11:05 AM

## 2013-02-06 NOTE — Discharge Summary (Signed)
Patient ID: Sydney Quinn,  MRN: [id number], DOB/AGE: 77-Apr-1931 77 y.o.  Admit date: 02/02/2013 Discharge date: 02/06/2013  Primary Care Provider: Verlon Au Primary Cardiologist: Pt will f/u with Velta Addison, MD in [facility]  Discharge Diagnoses Principal Problem:   A-fib  **s/p TEE/DCCV this admission.  **Amiodarone initiated this admission.  **Coumadin initiated this admission.  Active Problems:   Acute diastolic heart failure  **EF 60-65% by echo this admission.  **Discharge weight 108 lbs.   Hypokalemia   High cholesterol  Allergies Allergies  Allergen Reactions  . Biaxin [Clarithromycin] Rash   Procedures  2D Echocardiogram 9.9.2014  Study Conclusions  - Left ventricle: The cavity size was mildly dilated. Wall   thickness was increased in a pattern of severe LVH.   Systolic function was normal. The estimated ejection   fraction was in the range of 60% to 65%. - Aortic valve: Mild regurgitation. - Mitral valve: Severe MAC and thickening of mitral leaflets   which are not well seen. Pt1/2 and gradients suggest mild   MS Calcified annulus. Mild regurgitation. Valve area by   pressure half-time: 1.73cm^2. - Left atrium: The atrium was mildly dilated. - Right atrium: The atrium was mildly dilated. - Atrial septum: No defect or patent foramen ovale was   identified. - Tricuspid valve: Moderate regurgitation. - Pulmonary arteries: PA peak pressure: 68mm Hg (S). _____________   TEE and Cardioversion 9.10.2014  Procedure Details Consent: Obtained Time Out: Verified patient identification, verified procedure, site/side was marked, verified correct patient position, special equipment/implants available, Radiology Safety Procedures followed,  medications/allergies/relevent history reviewed, required imaging and test results available.  Performed  Medications: Fentanyl: 25 mcg Versed: 3 mg  Left Ventrical: LV EF 65-70%, severe concentric LVH Mitral  Valve: severe MAC, thickened mitral valve leaflets with restricted opening Aortic Valve: tricuspid, mild AI Tricuspid Valve: moderate TR Pulmonic Valve: normal Left Atrium/ Left atrial appendage: no thrombus, decreased filling and emptying velocities Atrial septum: lipomatous hyperthrophy of the interatrial septum, no ASD Aorta: ascending aorta is not dilated, no obvious plaque. In the descending portion of the aortic arch there is a large, highly mobile atherosclerotic plaque, in the remaining visualized portion of the thoracic aorta there is diffuse severe atherosclerotic plaque with another mobile portion at 35 cm. No obvious ulcer seen.    Complications: No apparent complications Patient did tolerate procedure well. _____________   CTA Chest 9.11.2014  IMPRESSION: Extensive heavy calcific atherosclerosis of the thoracic aorta. Negative for dissection or acute vascular process. 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  Negative for significant acute pulmonary embolus  Cardiomegaly with diffuse mild edema and pleural effusions compatible with mild CHF  Bibasilar atelectasis _____________   CTA Abdomen/Pelvis 9.11.2014   IMPRESSION: Stable extensive calcific atherosclerosis of the native abdominal aorta with luminal narrowing of the aorta in the suprarenal and infrarenal areas. No definite aortic occlusive process. 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 IMA also remains patent.  Stable patent iliac pelvic vasculature.  Negative for acute dissection or retroperitoneal hemorrhage. _____________   History of Present Illness  79 y/o female w/o prior cardiac hx.  She was in her usual state of health until 01/31/2013, when she began to experience dyspnea on exertion.  On 9/7, she began to note tachypalpitations associated with midsternal chest discomfort.  She saw her PCP on 9/8 and was found to be in atrial fibrillation with RVR.  She was sent to the [facility] ED for  further evaluation.  There,  HR's were int he 120's.  She was placed on heparin and diltiazem infusions and admitted for further evaluation.  Hospital Course  Following admission, pt remained in atrial fibrillation.  2D echo was carried out and showed normal LV function.  She was given 1 dose of IV lasix for volume overload with symptomatic improvement.  On 9/10, amiodarone was initiated and she underwent successful TEE and cardioversion with restoration of sinus rhythm.  Of note, TEE also revealed a large, highly mobile atherosclerotic plaque in the descending portion of the aortic, along with diffuse severe atherosclerotic plaque within the thoracic aorta with another mobile portion at 35 cm. No obvious ulcer was seen.  Follow-up CTA of the chest and abdomen was performed and showed atherosclerosis without acute process.     Post-cardioversion she has been feeling much better and ambulating without difficulty.  She has been transitioned from heparin bridging to lovenox bridging and her family plans to provide her injections every 12 hrs over the next few days.  She will be discharged home today.  She plans to f/u with her PCP on Monday for INR f/u.  We have arranged for cardiology f/u in [facility] in 1 month.  She will be discharged on Amiodarone 400mg  bid x 4 more days and then she has been instructed to reduce that dose to 400mg  daily x 2 wks and then reduce further to 200mg  daily.  Her son has been heavily involved in her care and has been given all pertinent instructions.  Discharge Vitals Blood pressure 82/52, pulse 75, temperature 98.6 F (37 C), temperature source Oral, resp. rate 15, height 5\' 4"  (1.626 m), weight 108 lb 14.5 oz (49.4 kg), SpO2 93.00%.  Filed Weights   02/02/13 1942 02/02/13 2045  Weight: 109 lb (49.442 kg) 108 lb 14.5 oz (49.4 kg)   Labs  CBC  Recent Labs  02/04/13 0435  WBC 11.7*  HGB 11.0*  HCT 33.1*  MCV 94.6  PLT 221   Basic Metabolic Panel  Recent Labs   08/65/78 0915  NA 142  K 3.2* - replaced prior to d/c.  CL 103  CO2 26  GLUCOSE 218*  BUN 9  CREATININE 0.91  CALCIUM 9.2   Liver Function Tests Lab Results  Component Value Date   ALT 19 02/02/2013   AST 26 02/02/2013   ALKPHOS 90 02/02/2013   BILITOT 0.6 02/02/2013   Cardiac Enzymes  Recent Labs  02/03/13 1455  TROPONINI <0.30   Fasting Lipid Panel Lab Results  Component Value Date   CHOL 115 02/03/2013   HDL 43 02/03/2013   LDLCALC 62 02/03/2013   TRIG 48 02/03/2013   CHOLHDL 2.7 02/03/2013   Thyroid Function Tests Lab Results  Component Value Date   TSH 2.244 02/02/2013    Disposition  Pt is being discharged home today in good condition.  Follow-up Plans & Appointments  Follow-up Information   Follow up with Olga Millers, MD On 03/11/2013. (12:30 PM - cardiology follow-up)    Specialty:  Cardiology   Contact information:   [address] [place] [address]       Follow up with Verlon Au, MD On 02/09/2013. (for INR (coumadin level))    Specialty:  Family Medicine   Contact information:   [address] [facility] [place] [address] [phone]      Discharge Medications    Medication List    STOP taking these medications  carvedilol 3.125 MG tablet  Commonly known as:  COREG      TAKE these medications       amiodarone 400 MG tablet  Commonly known as:  PACERONE  400mg  bid x 4 more days then reduce to 400mg  daily x 2 wks.     amiodarone 200 MG tablet  Commonly known as:  PACERONE  1 tab po daily to begin after all 400mg  tabs complete (~18 days).     aspirin 81 MG tablet  Take 81 mg by mouth daily.     CALCIUM 600 PO  Take 1 tablet by mouth daily.     Echinacea 400 MG Caps  Take 1 capsule by mouth daily.     enoxaparin 60 MG/0.6ML injection  Commonly known as:  LOVENOX  Inject 0.5 mLs (50 mg total) into the skin every 12 (twelve) hours.     fish oil-omega-3 fatty acids 1000 MG  capsule  Take 1 g by mouth daily.     gabapentin 100 MG capsule  Commonly known as:  NEURONTIN  Take 500 mg by mouth at bedtime.     isosorbide mononitrate 60 MG 24 hr tablet  Commonly known as:  IMDUR  Take 60 mg by mouth daily.     Levothyroxine Sodium 88 MCG Caps  Take by mouth daily before breakfast.     mesalamine 0.375 G 24 hr capsule  Commonly known as:  APRISO  Take 0.75 mg by mouth daily.     simvastatin 40 MG tablet  Commonly known as:  ZOCOR  TAKE 1 TABLET DAILY     traMADol 50 MG tablet  Commonly known as:  ULTRAM  Take 50 mg by mouth every 6 (six) hours as needed for pain.     warfarin 5 MG tablet  Commonly known as:  COUMADIN  Take 1 tablet (5 mg total) by mouth one time only at 6 PM.       Outstanding Labs/Studies  INR on 9/15 @ PCP  Duration of Discharge Encounter   Greater than 30 minutes including physician time.  Signed, Nicolasa Ducking NP 02/06/2013, 2:19 PM

## 2013-02-06 NOTE — Progress Notes (Signed)
ANTICOAGULATION CONSULT NOTE - Follow Up Consult  Pharmacy Consult for heparin Indication: atrial fibrillation  Labs:  Recent Labs  02/03/13 1455 02/04/13 0435 02/05/13 0755 02/06/13 0353  HGB  --  11.0*  --   --   HCT  --  33.1*  --   --   PLT  --  221  --   --   LABPROT  --   --  14.5 15.8*  INR  --   --  1.15 1.29  HEPARINUNFRC 0.40 0.40 0.33 0.26*  TROPONINI <0.30  --   --   --     Assessment: 79yo female now subtherapeutic on heparin after three levels at goal though had been trending down.  Goal of Therapy:  Heparin level 0.3-0.7 units/ml   Plan:  Will increase heparin gtt by 1 unit/kg/hr to 950 units/hr and check level in 8hr.  Vernard Gambles, PharmD, BCPS  02/06/2013,5:47 AM

## 2013-02-06 NOTE — Progress Notes (Signed)
Pt complaining of SOB and restless. 2L [place] applied. Went back in to assess pt again. Pt states "the O2 has helped a lot. I feel much better" Will leave [place] 2l on while pt is asleep. Will continue to assess and monitor.

## 2013-02-06 NOTE — Discharge Summary (Signed)
The patient was seen, examined and discussed with Ward Givens, NP. I agree with the above.  Tobias Alexander, MD 02/06/2013

## 2013-02-21 ENCOUNTER — Other Ambulatory Visit (HOSPITAL_COMMUNITY): Payer: Self-pay | Admitting: Nurse Practitioner

## 2013-02-24 ENCOUNTER — Encounter (HOSPITAL_COMMUNITY): Payer: Self-pay

## 2013-02-24 ENCOUNTER — Inpatient Hospital Stay (HOSPITAL_COMMUNITY)
Admission: EM | Admit: 2013-02-24 | Discharge: 2013-03-03 | DRG: 378 | Disposition: A | Discharge status: Home / Self Care | Payer: Medicare Other | Attending: Internal Medicine | Admitting: Internal Medicine

## 2013-02-24 DIAGNOSIS — D62 Acute posthemorrhagic anemia: Secondary | ICD-10-CM | POA: Diagnosis present

## 2013-02-24 DIAGNOSIS — Z8249 Family history of ischemic heart disease and other diseases of the circulatory system: Secondary | ICD-10-CM

## 2013-02-24 DIAGNOSIS — I252 Old myocardial infarction: Secondary | ICD-10-CM

## 2013-02-24 DIAGNOSIS — K31811 Angiodysplasia of stomach and duodenum with bleeding: Principal | ICD-10-CM | POA: Diagnosis present

## 2013-02-24 DIAGNOSIS — I5031 Acute diastolic (congestive) heart failure: Secondary | ICD-10-CM

## 2013-02-24 DIAGNOSIS — R0609 Other forms of dyspnea: Secondary | ICD-10-CM

## 2013-02-24 DIAGNOSIS — Z79899 Other long term (current) drug therapy: Secondary | ICD-10-CM

## 2013-02-24 DIAGNOSIS — E039 Hypothyroidism, unspecified: Secondary | ICD-10-CM | POA: Diagnosis present

## 2013-02-24 DIAGNOSIS — F17200 Nicotine dependence, unspecified, uncomplicated: Secondary | ICD-10-CM | POA: Diagnosis present

## 2013-02-24 DIAGNOSIS — E119 Type 2 diabetes mellitus without complications: Secondary | ICD-10-CM | POA: Diagnosis present

## 2013-02-24 DIAGNOSIS — E7800 Pure hypercholesterolemia, unspecified: Secondary | ICD-10-CM | POA: Diagnosis present

## 2013-02-24 DIAGNOSIS — D6832 Hemorrhagic disorder due to extrinsic circulating anticoagulants: Secondary | ICD-10-CM

## 2013-02-24 DIAGNOSIS — K5900 Constipation, unspecified: Secondary | ICD-10-CM | POA: Diagnosis present

## 2013-02-24 DIAGNOSIS — E860 Dehydration: Secondary | ICD-10-CM

## 2013-02-24 DIAGNOSIS — Z7982 Long term (current) use of aspirin: Secondary | ICD-10-CM

## 2013-02-24 DIAGNOSIS — Z23 Encounter for immunization: Secondary | ICD-10-CM

## 2013-02-24 DIAGNOSIS — I080 Rheumatic disorders of both mitral and aortic valves: Secondary | ICD-10-CM | POA: Diagnosis present

## 2013-02-24 DIAGNOSIS — I739 Peripheral vascular disease, unspecified: Secondary | ICD-10-CM | POA: Diagnosis present

## 2013-02-24 DIAGNOSIS — T45515A Adverse effect of anticoagulants, initial encounter: Secondary | ICD-10-CM | POA: Diagnosis present

## 2013-02-24 DIAGNOSIS — E876 Hypokalemia: Secondary | ICD-10-CM

## 2013-02-24 DIAGNOSIS — D649 Anemia, unspecified: Secondary | ICD-10-CM

## 2013-02-24 DIAGNOSIS — I4891 Unspecified atrial fibrillation: Secondary | ICD-10-CM | POA: Diagnosis present

## 2013-02-24 DIAGNOSIS — R791 Abnormal coagulation profile: Secondary | ICD-10-CM | POA: Diagnosis present

## 2013-02-24 DIAGNOSIS — N289 Disorder of kidney and ureter, unspecified: Secondary | ICD-10-CM

## 2013-02-24 DIAGNOSIS — I10 Essential (primary) hypertension: Secondary | ICD-10-CM | POA: Diagnosis present

## 2013-02-24 DIAGNOSIS — I2510 Atherosclerotic heart disease of native coronary artery without angina pectoris: Secondary | ICD-10-CM | POA: Diagnosis present

## 2013-02-24 DIAGNOSIS — N179 Acute kidney failure, unspecified: Secondary | ICD-10-CM | POA: Diagnosis present

## 2013-02-24 DIAGNOSIS — I4892 Unspecified atrial flutter: Secondary | ICD-10-CM | POA: Diagnosis present

## 2013-02-24 DIAGNOSIS — Z9089 Acquired absence of other organs: Secondary | ICD-10-CM

## 2013-02-24 DIAGNOSIS — Z7901 Long term (current) use of anticoagulants: Secondary | ICD-10-CM

## 2013-02-24 DIAGNOSIS — K922 Gastrointestinal hemorrhage, unspecified: Secondary | ICD-10-CM

## 2013-02-24 DIAGNOSIS — K31819 Angiodysplasia of stomach and duodenum without bleeding: Secondary | ICD-10-CM

## 2013-02-24 HISTORY — DX: Calculus of kidney: N20.0

## 2013-02-24 HISTORY — DX: Peripheral vascular disease, unspecified: I73.9

## 2013-02-24 HISTORY — DX: Essential (primary) hypertension: I10

## 2013-02-24 HISTORY — DX: Hypothyroidism, unspecified: E03.9

## 2013-02-24 LAB — BASIC METABOLIC PANEL
Calcium: 10.5 mg/dL (ref 8.4–10.5)
Chloride: 99 mEq/L (ref 96–112)
Creatinine, Ser: 1.79 mg/dL — ABNORMAL HIGH (ref 0.50–1.10)
GFR calc Af Amer: 29 mL/min — ABNORMAL LOW (ref >90)

## 2013-02-24 LAB — PROTIME-INR
INR: 1.93 — ABNORMAL HIGH (ref 0.00–1.49)
Prothrombin Time: 21.5 seconds — ABNORMAL HIGH (ref 11.6–15.2)

## 2013-02-24 LAB — CBC
MCV: 98.6 fL (ref 78.0–100.0)
Platelets: 208 10*3/uL (ref 150–400)
RDW: 18.8 % — ABNORMAL HIGH (ref 11.5–15.5)
WBC: 8.5 10*3/uL (ref 4.0–10.5)

## 2013-02-24 LAB — POCT I-STAT TROPONIN I

## 2013-02-24 LAB — ABO/RH: ABO/RH(D): A POS

## 2013-02-24 LAB — OCCULT BLOOD, POC DEVICE: Fecal Occult Bld: POSITIVE — AB

## 2013-02-24 MED ORDER — GABAPENTIN 400 MG PO CAPS
500.0000 mg | ORAL_CAPSULE | Freq: Every day | ORAL | Status: DC
  Administered 2013-02-24 – 2013-03-02 (×7): 500 mg via ORAL
  Filled 2013-02-24 (×8): qty 1

## 2013-02-24 MED ORDER — SODIUM CHLORIDE 0.9 % IJ SOLN
3.0000 mL | Freq: Two times a day (BID) | INTRAMUSCULAR | Status: DC
  Administered 2013-02-24 – 2013-03-03 (×12): 3 mL via INTRAVENOUS

## 2013-02-24 MED ORDER — SODIUM CHLORIDE 0.9 % IV BOLUS (SEPSIS)
500.0000 mL | Freq: Once | INTRAVENOUS | Status: AC
  Administered 2013-02-24: 500 mL via INTRAVENOUS

## 2013-02-24 MED ORDER — ONDANSETRON HCL 4 MG/2ML IJ SOLN
4.0000 mg | Freq: Four times a day (QID) | INTRAMUSCULAR | Status: DC | PRN
  Filled 2013-02-24: qty 2

## 2013-02-24 MED ORDER — SENNOSIDES-DOCUSATE SODIUM 8.6-50 MG PO TABS
1.0000 | ORAL_TABLET | Freq: Once | ORAL | Status: AC
  Administered 2013-02-24: 20:00:00 1 via ORAL
  Filled 2013-02-24: qty 1

## 2013-02-24 MED ORDER — ONDANSETRON HCL 4 MG PO TABS: 4.0000 mg | ORAL_TABLET | Freq: Four times a day (QID) | ORAL | Status: DC | PRN

## 2013-02-24 MED ORDER — ACETAMINOPHEN 325 MG PO TABS: 650.0000 mg | ORAL_TABLET | Freq: Four times a day (QID) | ORAL | Status: DC | PRN

## 2013-02-24 MED ORDER — HYDROMORPHONE HCL PF 1 MG/ML IJ SOLN: 1.0000 mg | INTRAMUSCULAR | Status: DC | PRN

## 2013-02-24 MED ORDER — BISACODYL 10 MG RE SUPP
10.0000 mg | Freq: Once | RECTAL | Status: AC
  Administered 2013-02-24: 10 mg via RECTAL
  Filled 2013-02-24: qty 1

## 2013-02-24 MED ORDER — ACETAMINOPHEN 650 MG RE SUPP: 650.0000 mg | Freq: Four times a day (QID) | RECTAL | Status: DC | PRN

## 2013-02-24 MED ORDER — AMIODARONE HCL 200 MG PO TABS
400.0000 mg | ORAL_TABLET | Freq: Every day | ORAL | Status: DC
  Administered 2013-02-25 – 2013-03-03 (×7): 400 mg via ORAL
  Filled 2013-02-24 (×7): qty 2

## 2013-02-24 MED ORDER — SODIUM CHLORIDE 0.9 % IV SOLN
INTRAVENOUS | Status: DC
  Administered 2013-02-25: 75 mL/h via INTRAVENOUS

## 2013-02-24 MED ORDER — PANTOPRAZOLE SODIUM 40 MG IV SOLR
40.0000 mg | Freq: Two times a day (BID) | INTRAVENOUS | Status: DC
  Administered 2013-02-24 – 2013-02-25 (×2): 40 mg via INTRAVENOUS
  Filled 2013-02-24 (×4): qty 40

## 2013-02-24 MED ORDER — TRAMADOL HCL 50 MG PO TABS: 50.0000 mg | ORAL_TABLET | Freq: Four times a day (QID) | ORAL | Status: DC | PRN

## 2013-02-24 MED ORDER — PHYTONADIONE 5 MG PO TABS
5.0000 mg | ORAL_TABLET | Freq: Once | ORAL | Status: AC
  Administered 2013-02-24: 20:00:00 5 mg via ORAL
  Filled 2013-02-24 (×2): qty 1

## 2013-02-24 NOTE — Consult Note (Signed)
HPI: 79 year old female with past medical history of atrial fibrillation, diabetes mellitus, hypertension, hypothyroid for evaluation of atrial fibrillation and anemia. Patient recently admitted with symptomatic atrial fibrillation with rapid ventricular response. Echocardiogram on 02/03/2013 showed normal LV function, severe left ventricular hypertrophy, mild aortic insufficiency, mild mitral stenosis/mitral regurgitation and mild biatrial enlargement. There is moderate tricuspid regurgitation. Transesophageal echocardiogram on September 10 showed no thrombus. Patient underwent cardioversion on September 10. She was treated with Lovenox/Coumadin and aspirin following the procedure. Patient states 3 days following admission she had a black bowel movement. This slowly improved. Over the past 3 days she has noticed increased dyspnea on exertion and significant fatigue. She saw her primary care physician today and her hemoglobin was noted to be 7. She was sent to the emergency room and also noted to be in atrial fibrillation. Cardiology is asked to consult.   (Not in a hospital admission)  Allergies  Allergen Reactions  . Biaxin [Clarithromycin] Rash    Past Medical History  Diagnosis Date  . Pancreatitis   . Hypertension   . Myocardial infarction   . Hypothyroid   . High cholesterol   . PAF (paroxysmal atrial fibrillation)     a. 01/2013 s/p TEE/DCCV (EF 65-70%, sev conc LVH, mod TR, no thrombus).  . Collagen vascular disease   . Diabetes mellitus without complication     Past Surgical History  Procedure Laterality Date  . Vascular surgery    . Appendectomy    . Tee without cardioversion N/A 02/04/2013    Procedure: TRANSESOPHAGEAL ECHOCARDIOGRAM (TEE);  Surgeon: Vesta Mixer, MD;  Location: [facility] ENDOSCOPY;  Service: Cardiovascular;  Laterality: N/A;  . Cardioversion N/A 02/04/2013    Procedure: CARDIOVERSION;  Surgeon: Vesta Mixer, MD;  Location: [facility] ENDOSCOPY;  Service:  Cardiovascular;  Laterality: N/A;  . Cea      History   Social History  . Marital Status: Widowed    Spouse Name: N/A    Number of Children: 2  . Years of Education: N/A   Occupational History  . Not on file.   Social History Main Topics  . Smoking status: Current Some Day Smoker -- 0.25 packs/day  . Smokeless tobacco: Not on file  . Alcohol Use: No  . Drug Use: Not on file  . Sexual Activity: Not on file   Other Topics Concern  . Not on file   Social History Narrative  . No narrative on file    Family History  Problem Relation Age of Onset  . Heart disease      No family history    ROS:  no fevers or chills, productive cough, hemoptysis, dysphasia, odynophagia, melena, hematochezia, dysuria, hematuria, rash, seizure activity, orthopnea, PND, pedal edema, claudication. Remaining systems are negative.  Physical Exam:   Blood pressure 130/85, pulse 87, temperature 98.2 F (36.8 C), temperature source Oral, resp. rate 18, height 5\' 4"  (1.626 m), weight 108 lb (48.988 kg), SpO2 95.00%.  General:  Well developed/frail in NAD Skin warm/dry Patient not depressed No peripheral clubbing Back-normal HEENT-normal/normal eyelids Neck supple/normal carotid upstroke bilaterally; bilateral bruits; Previous right CEA; no JVD; no thyromegaly chest - CTA/ normal expansion CV - RRR/normal S1 and S2; no rubs or gallops;  PMI nondisplaced, 2/6 systolic murmur and 1/6 diastolic murmur left sternal border. Abdomen -NT/ND, no HSM, no mass, + bowel sounds, positive bruit 2+ femoral pulses, no bruits Ext-no edema, chords, 2+ DP Neuro-grossly nonfocal  ECG atrial fibrillation, right bundle branch block, left anterior fascicular  block, left ventricular hypertrophy, inferior lateral T-wave inversion.  Results for orders placed during the hospital encounter of 02/24/13 (from the past 48 hour(s))  CBC     Status: Abnormal   Collection Time    02/24/13  2:06 PM      Result Value Range     WBC 8.5  4.0 - 10.5 K/uL   RBC 2.18 (*) 3.87 - 5.11 MIL/uL   Hemoglobin 7.0 (*) 12.0 - 15.0 g/dL   Comment: REPEATED TO VERIFY   HCT 21.5 (*) 36.0 - 46.0 %   MCV 98.6  78.0 - 100.0 fL   MCH 32.1  26.0 - 34.0 pg   MCHC 32.6  30.0 - 36.0 g/dL   RDW 40.9 (*) 81.1 - 91.4 %   Platelets 208  150 - 400 K/uL  BASIC METABOLIC PANEL     Status: Abnormal   Collection Time    02/24/13  2:06 PM      Result Value Range   Sodium 139  135 - 145 mEq/L   Potassium 3.8  3.5 - 5.1 mEq/L   Chloride 99  96 - 112 mEq/L   CO2 31  19 - 32 mEq/L   Glucose, Bld 107 (*) 70 - 99 mg/dL   BUN 34 (*) 6 - 23 mg/dL   Creatinine, Ser 7.82 (*) 0.50 - 1.10 mg/dL   Calcium 95.6  8.4 - 21.3 mg/dL   GFR calc non Af Amer 25 (*) >90 mL/min   GFR calc Af Amer 29 (*) >90 mL/min   Comment: (NOTE)     The eGFR has been calculated using the CKD EPI equation.     This calculation has not been validated in all clinical situations.     eGFR's persistently <90 mL/min signify possible Chronic Kidney     Disease.  PROTIME-INR     Status: Abnormal   Collection Time    02/24/13  2:06 PM      Result Value Range   Prothrombin Time 21.5 (*) 11.6 - 15.2 seconds   INR 1.93 (*) 0.00 - 1.49  TYPE AND SCREEN     Status: None   Collection Time    02/24/13  2:08 PM      Result Value Range   ABO/RH(D) A POS     Antibody Screen NEG     Sample Expiration 02/27/2013    PREPARE RBC (CROSSMATCH)     Status: None   Collection Time    02/24/13  2:08 PM      Result Value Range   Order Confirmation ORDER PROCESSED BY BLOOD BANK    SAMPLE TO BLOOD BANK     Status: None   Collection Time    02/24/13  2:18 PM      Result Value Range   Blood Bank Specimen SAMPLE AVAILABLE FOR TESTING     Sample Expiration 02/25/2013    POCT I-STAT TROPONIN I     Status: None   Collection Time    02/24/13  2:38 PM      Result Value Range   Troponin i, poc 0.05  0.00 - 0.08 ng/mL   Comment 3            Comment: Due to the release kinetics of cTnI,      a negative result within the first hours     of the onset of symptoms does not rule out     myocardial infarction with certainty.     If myocardial infarction  is still suspected,     repeat the test at appropriate intervals.  OCCULT BLOOD, POC DEVICE     Status: Abnormal   Collection Time    02/24/13  2:43 PM      Result Value Range   Fecal Occult Bld POSITIVE (*) NEGATIVE    No results found.  Assessment/Plan 1 atrial fibrillation-the patient has developed recurrent atrial fibrillation. Her rate is 90 to 110. Would continue amiodarone for rate control. She is mildly tachycardic most likely related to significant anemia. Additional medications can be added for rate control as needed. Given the decreased hemoglobin from 11 to 7 and recent melena would discontinue Coumadin and ASA. There is risk of an embolic event given the recurrent atrial fibrillation. Also ideally Coumadin would be continued uninterrupted for 4 weeks following recent procedure. However given probable GI bleed and anemia we have no other choice. Once etiology of bleed is addressed we will resume anticoagulation. Could repeat cardioversion afterwards now that amiodarone is completely in her system. She was symptomatic with her atrial fibrillation previously and this certainly could be contributing to her weakness and dyspnea in addition to her acute blood loss anemia. 2 acute blood loss anemia-most likely GI in etiology given history of melena 3 days following discharge. Would ask GI to evaluate. Would treat with PPI. Transfuse as needed. Follow Hgb. INR 1.93. Would reverse coumadin with vitamin K. DC ASA. 3 acute renal failure-BUN and creatinine are increased compared to recent discharge. Possibly related hypoperfusion related to anemia/blood loss. Follow renal function. 4 tobacco abuse-patient counseled on discontinuing.  Olga Millers MD 02/24/2013, 4:42 PM

## 2013-02-24 NOTE — Progress Notes (Signed)
Pt rec form ED. Pt o4x no complaints of pain or SOB. Will continue to monitor

## 2013-02-24 NOTE — H&P (Signed)
History and Physical       Hospital Admission Note Date: 02/24/2013  Patient name: Sydney Quinn Medical record number: [id number] Date of birth: [birth year] Age: 79 y.o. Gender: female PCP: Verlon Au, MD    Chief Complaint:  Anemia with dark melanotic stool,  generalized weakness  HPI: Patient is 79 year old female with history of hypertension, CAD, atrial fibrillation s/p cardioversion on 02/04/2013, was discharged on 02/06/13 from [facility] on amiodarone and Coumadin. Patient presented back to ER with increasing generalized weakness, fatigue and "low energy". Patient lives at home by herself. Patient was accompanied by her son. Patient reports that 3 days after she was discharged, she had dark black melanotic stool but she did not pay much attention at that time. She usually has constipation. However in the last 2-3 days, she's been having poor appetite, increasing generalized weakness, fatigue and dyspnea on exertion. ER workup showed hemoglobin of 7.0 with hematocrit of 21.5, on 9/10 hemoglobin was 11. BUN 34, creatinine 1.7, (on 9/12 BUN 9, creatinine 0.9), INR 1.93. FOBT positive  Review of Systems:  Constitutional: Denies fever, chills, diaphoresis,+  poor appetite and fatigue.  HEENT: Denies photophobia, eye pain, redness, hearing loss, ear pain, congestion, sore throat, rhinorrhea, sneezing, mouth sores, trouble swallowing, neck pain, neck stiffness and tinnitus.   Respiratory: Denies cough, chest tightness,  and wheezing.  + dyspnea on exertion Cardiovascular: Denies chest pain, palpitations and leg swelling.  Gastrointestinal: Denies nausea, vomiting, abdominal pain, diarrhea, constipation with melanotic stools+  Genitourinary: Denies dysuria, urgency, frequency, hematuria, flank pain and difficulty urinating.  Musculoskeletal: Denies myalgias, back pain, joint swelling, arthralgias and gait problem.  Skin: Denies pallor,  rash and wound.  Neurological: Denies seizures, syncope, weakness,numbness and headaches. + mild dizziness  Hematological: Denies adenopathy. Easy bruising, personal or family bleeding history  Psychiatric/Behavioral: Denies suicidal ideation, mood changes, confusion, nervousness, sleep disturbance and agitation  Past Medical History: Past Medical History  Diagnosis Date  . Pancreatitis   . Hypertension   . Myocardial infarction   . Hypothyroid   . High cholesterol   . PAF (paroxysmal atrial fibrillation)     a. 01/2013 s/p TEE/DCCV (EF 65-70%, sev conc LVH, mod TR, no thrombus).  . Collagen vascular disease   . Diabetes mellitus without complication    Past Surgical History  Procedure Laterality Date  . Vascular surgery    . Appendectomy    . Tee without cardioversion N/A 02/04/2013    Procedure: TRANSESOPHAGEAL ECHOCARDIOGRAM (TEE);  Surgeon: Vesta Mixer, MD;  Location: [facility] ENDOSCOPY;  Service: Cardiovascular;  Laterality: N/A;  . Cardioversion N/A 02/04/2013    Procedure: CARDIOVERSION;  Surgeon: Vesta Mixer, MD;  Location: [facility] ENDOSCOPY;  Service: Cardiovascular;  Laterality: N/A;  . Cea      Medications: Prior to Admission medications   Medication Sig Start Date End Date Taking? Authorizing Provider  amiodarone (PACERONE) 400 MG tablet Take 400 mg by mouth daily. 02/06/13  Yes Ok Anis, NP  aspirin 81 MG tablet Take 81 mg by mouth daily.   Yes Historical Provider, MD  Calcium Carbonate (CALCIUM 600 PO) Take 1 tablet by mouth daily.    Yes Historical Provider, MD  Echinacea 400 MG CAPS Take 1 capsule by mouth daily.    Yes Historical Provider, MD  fish oil-omega-3 fatty acids 1000 MG capsule Take 1 g by mouth daily.   Yes Historical Provider, MD  gabapentin (NEURONTIN) 100 MG capsule Take 500 mg by mouth at bedtime.   Yes  Historical Provider, MD  isosorbide mononitrate (IMDUR) 60 MG 24 hr tablet Take 60 mg by mouth daily.   Yes Historical Provider, MD   Levothyroxine Sodium 88 MCG CAPS Take by mouth daily before breakfast.   Yes Historical Provider, MD  simvastatin (ZOCOR) 40 MG tablet TAKE 1 TABLET DAILY 05/02/12  Yes Eleanore E Egan, PA-C  traMADol (ULTRAM) 50 MG tablet Take 50 mg by mouth every 6 (six) hours as needed for pain.   Yes Historical Provider, MD  warfarin (COUMADIN) 4 MG tablet Take 2 mg by mouth daily.   Yes Historical Provider, MD    Allergies:   Allergies  Allergen Reactions  . Biaxin [Clarithromycin] Rash    Social History:  reports that she has been smoking.  She does not have any smokeless tobacco history on file. She reports that she does not drink alcohol. Her drug history is not on file.  Family History: Family History  Problem Relation Age of Onset  . Heart disease      No family history    Physical Exam: Blood pressure 85/43, pulse 89, temperature 97.7 F (36.5 C), temperature source Oral, resp. rate 15, height 5\' 4"  (1.626 m), weight 48.988 kg (108 lb), SpO2 95.00%. General: Alert, awake, oriented x3, in no acute distress. HEENT: normocephalic, atraumatic, anicteric sclera, pink conjunctiva, PERLA, oropharynx clear Neck: supple, no masses or lymphadenopathy, no goiter, no bruits  Heart: irregularly irregular, tachycardia  Lungs: Clear to auscultation bilaterally, no wheezing, rales or rhonchi. Abdomen: Soft, nontender, nondistended, positive bowel sounds, no masses. Extremities: No clubbing, cyanosis or edema with positive pedal pulses. Neuro: Grossly intact, no focal neurological deficits, strength 5/5 upper and lower extremities bilaterally Psych: alert and oriented x 3, normal mood and affect Skin: no rashes or lesions, warm and dry   LABS on Admission:  Basic Metabolic Panel:  Recent Labs Lab 02/24/13 1406  NA 139  K 3.8  CL 99  CO2 31  GLUCOSE 107*  BUN 34*  CREATININE 1.79*  CALCIUM 10.5   Liver Function Tests: No results found for this basename: AST, ALT, ALKPHOS, BILITOT,  PROT, ALBUMIN,  in the last 168 hours No results found for this basename: LIPASE, AMYLASE,  in the last 168 hours No results found for this basename: AMMONIA,  in the last 168 hours CBC:  Recent Labs Lab 02/24/13 1406  WBC 8.5  HGB 7.0*  HCT 21.5*  MCV 98.6  PLT 208   Cardiac Enzymes: No results found for this basename: CKTOTAL, CKMB, CKMBINDEX, TROPONINI,  in the last 168 hours BNP: No components found with this basename: POCBNP,  CBG: No results found for this basename: GLUCAP,  in the last 168 hours   Radiological Exams on Admission: Dg Chest Port 1 View  02/04/2013   *RADIOLOGY REPORT*  Clinical Data: Shortness of breath  PORTABLE CHEST - 1 VIEW  Comparison: 02/02/2013  Findings: Cardiac enlargement.  Since the previous study, there is progression of pulmonary vascular congestion with increasing edema, increasing small bilateral pleural effusions, and developing basilar atelectasis.  Calcified and tortuous aorta.  No pneumothorax.  Degenerative changes in the spine.  Vascular calcifications.  IMPRESSION: Cardiac enlargement with progression of pulmonary vascular congestion, pulmonary edema, and bilateral pleural effusions with basilar atelectasis.   Original Report Authenticated By: Burman Nieves, M.D.   Dg Chest Port 1 View  02/02/2013   *RADIOLOGY REPORT*  Clinical Data: Atrial fibrillation.  PORTABLE CHEST - 1 VIEW  Comparison: None.  Findings: Single view of  the chest demonstrates enlargement of the cardiac silhouette.  Prominent lung markings, particularly at the lung bases.  Trachea is midline.  Bony thorax is intact.  Negative for a pneumothorax.  IMPRESSION: Cardiomegaly and evidence for interstitial pulmonary edema.   Original Report Authenticated By: Richarda Overlie, M.D.   Ct Angio Chest Aortic Dissect W &/or W/o  02/05/2013   *RADIOLOGY REPORT*  Clinical Data:  Extensive atherosclerotic plaquing in the descending aortic arch by transesophageal echo concerning for ulcerated  plaque formation or dissection.  History of mesenteric bypass  CT ANGIOGRAPHY CHEST, ABDOMEN AND PELVIS  Technique:  Multidetector CT imaging through the chest, abdomen and pelvis was performed using the standard protocol during bolus administration of intravenous contrast.  Multiplanar reconstructed images including MIPs were obtained and reviewed to evaluate the vascular anatomy.  Contrast: 80mL OMNIPAQUE IOHEXOL 350 MG/ML SOLN  Comparison:  09/26/2010, 07/20/2010  CTA CHEST  Findings:  Extensive heavy calcification of the major branch vessels and the entire thoracic aorta.  Negative for acute dissection.  Diffuse mural thickening of the descending aorta.  No large ulcerated plaque formation appreciated.  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  Brachiocephalic and left common carotid arteries are heavily calcified but patent.  No definite hyperdense intramural hemorrhage appreciated on the initial noncontrast phase of the chest.  Central pulmonary arteries are patent.  No large pulmonary embolus or filling defect.  Extensive mitral valve annular calcifications and native coronary calcifications.  Heart is enlarged.  No pericardial effusion.  Small dependent non loculated pleural effusions bilaterally.  Mild adenopathy in the chest with an AP window node measuring 10 mm short axis.  Precarinal lymph nodes measure 12 mm in short axis. These are likely reactive.  Lung windows demonstrate diffuse patchy ground-glass opacity throughout both lungs with interlobular septal thickening compatible with edema.  Patchy bibasilar atelectasis noted. Trachea and central airways appear patent.  No mucous plugging or bronchiectasis.  No definite focal pneumonia.  Degenerative changes noted of the thoracic spine.  No compression fracture.   Review of the MIP images confirms the above findings.  IMPRESSION: Extensive heavy calcific atherosclerosis of the thoracic aorta. Negative for dissection or acute vascular process.   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  Negative for significant acute pulmonary embolus  Cardiomegaly with diffuse mild edema and pleural effusions compatible with mild CHF  Bibasilar atelectasis  CTA ABDOMEN AND PELVIS  Findings:  Upper abdominal aorta demonstrates a bypass graft which is patent from the aorta to the celiac and superior mesenteric arteries.  Celiac and SMA branches are patent.  IMA remains patent off the distal aorta.  Extensive heavy calcification of the abdominal aorta with marked luminal narrowing and calcified thrombus involving the suprarenal aorta and infrarenal aorta. Despite his significant aortic narrowing, the aorta remains patent. No definite acute dissection.  Negative for retroperitoneal hemorrhage or rupture.  Aortic bifurcation remains patent.  Common, internal, external iliac arteries in the pelvis are patent.  Common femoral, profunda femoral, proximal superficial femoral arteries into the lower extremities are patent.  No iliac inflow disease, dissection, aneurysm or occlusive process.  Exam of the abdominal vasculature is stable.  Nonvascular findings:  Liver, gallbladder, biliary system, pancreas, mildly thickened adrenal glands, and kidneys are within normal limits for age and demonstrate no acute process.  Renal atrophy of the right kidney lower pole with small cysts.  Negative for bowel obstruction, dilatation, ileus, or free air. Exam of bowel is limited without oral contrast.  No  abdominal or pelvic free fluid, fluid collection, hemorrhage, or abscess.  Urinary bladder unremarkable.  Uterus is atrophic.  Stool- filled bowel in the pelvis.  No inguinal abnormality or hernia.  Diffuse degenerative changes and osteopenia of the spine, pelvis and hips.   Review of the MIP images confirms the above findings.  IMPRESSION: Stable extensive calcific atherosclerosis of the native abdominal aorta with luminal narrowing of the aorta in the suprarenal and infrarenal areas.   No definite aortic occlusive process.  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  IMA also remains patent.  Stable patent iliac pelvic vasculature.  Negative for acute dissection or retroperitoneal hemorrhage.   Original Report Authenticated By: Judie Petit. Miles Costain, M.D.   Ct Angio Abd/pel W/ And/or W/o  02/05/2013   *RADIOLOGY REPORT*  Clinical Data:  Extensive atherosclerotic plaquing in the descending aortic arch by transesophageal echo concerning for ulcerated plaque formation or dissection.  History of mesenteric bypass  CT ANGIOGRAPHY CHEST, ABDOMEN AND PELVIS  Technique:  Multidetector CT imaging through the chest, abdomen and pelvis was performed using the standard protocol during bolus administration of intravenous contrast.  Multiplanar reconstructed images including MIPs were obtained and reviewed to evaluate the vascular anatomy.  Contrast: 80mL OMNIPAQUE IOHEXOL 350 MG/ML SOLN  Comparison:  09/26/2010, 07/20/2010  CTA CHEST  Findings:  Extensive heavy calcification of the major branch vessels and the entire thoracic aorta.  Negative for acute dissection.  Diffuse mural thickening of the descending aorta.  No large ulcerated plaque formation appreciated.  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  Brachiocephalic and left common carotid arteries are heavily calcified but patent.  No definite hyperdense intramural hemorrhage appreciated on the initial noncontrast phase of the chest.  Central pulmonary arteries are patent.  No large pulmonary embolus or filling defect.  Extensive mitral valve annular calcifications and native coronary calcifications.  Heart is enlarged.  No pericardial effusion.  Small dependent non loculated pleural effusions bilaterally.  Mild adenopathy in the chest with an AP window node measuring 10 mm short axis.  Precarinal lymph nodes measure 12 mm in short axis. These are likely reactive.  Lung windows demonstrate diffuse patchy ground-glass opacity throughout both lungs with  interlobular septal thickening compatible with edema.  Patchy bibasilar atelectasis noted. Trachea and central airways appear patent.  No mucous plugging or bronchiectasis.  No definite focal pneumonia.  Degenerative changes noted of the thoracic spine.  No compression fracture.   Review of the MIP images confirms the above findings.  IMPRESSION: Extensive heavy calcific atherosclerosis of the thoracic aorta. Negative for dissection or acute vascular process.  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  Negative for significant acute pulmonary embolus  Cardiomegaly with diffuse mild edema and pleural effusions compatible with mild CHF  Bibasilar atelectasis  CTA ABDOMEN AND PELVIS  Findings:  Upper abdominal aorta demonstrates a bypass graft which is patent from the aorta to the celiac and superior mesenteric arteries.  Celiac and SMA branches are patent.  IMA remains patent off the distal aorta.  Extensive heavy calcification of the abdominal aorta with marked luminal narrowing and calcified thrombus involving the suprarenal aorta and infrarenal aorta. Despite his significant aortic narrowing, the aorta remains patent. No definite acute dissection.  Negative for retroperitoneal hemorrhage or rupture.  Aortic bifurcation remains patent.  Common, internal, external iliac arteries in the pelvis are patent.  Common femoral, profunda femoral, proximal superficial femoral arteries into the lower extremities are patent.  No iliac inflow disease, dissection, aneurysm  or occlusive process.  Exam of the abdominal vasculature is stable.  Nonvascular findings:  Liver, gallbladder, biliary system, pancreas, mildly thickened adrenal glands, and kidneys are within normal limits for age and demonstrate no acute process.  Renal atrophy of the right kidney lower pole with small cysts.  Negative for bowel obstruction, dilatation, ileus, or free air. Exam of bowel is limited without oral contrast.  No abdominal or pelvic free  fluid, fluid collection, hemorrhage, or abscess.  Urinary bladder unremarkable.  Uterus is atrophic.  Stool- filled bowel in the pelvis.  No inguinal abnormality or hernia.  Diffuse degenerative changes and osteopenia of the spine, pelvis and hips.   Review of the MIP images confirms the above findings.  IMPRESSION: Stable extensive calcific atherosclerosis of the native abdominal aorta with luminal narrowing of the aorta in the suprarenal and infrarenal areas.  No definite aortic occlusive process.  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  IMA also remains patent.  Stable patent iliac pelvic vasculature.  Negative for acute dissection or retroperitoneal hemorrhage.   Original Report Authenticated By: Judie Petit. Miles Costain, M.D.    Assessment/Plan Principal Problem:  Acute blood loss anemia secondary to GI bleed : Likely secondary to warfarin-induced coagulopathy - Currently hemodynamically stable, not actively bleeding, admit to tele, transfuse 2 units of packed rbc's, vitamin K 5 mg , IV PPI - Discussed with gastroenterology, placed on clear liquid diet, n.p.o. after midnight - Discontinue Coumadin  Active Problems:   A-fib: Now currently back in atrial fibrillation despite cardioversion on 02/04/13, on amiodarone - Will continue amiodarone - Called cardiology consultation, patient's BP is soft 80-90's during my encounter, will not be able to give BB or CCB - will gently hydrate with IV fluids.    Acute renal insufficiency: Likely secondary to GI bleed - Will place on gentle hydration and 2 units of packed RBC transfusion    Warfarin-induced coagulopathy - Reverse with vitamin K, PT/INR in a.m.  DVT prophylaxis: SCDs  CODE STATUS: Full code  Further plan will depend as patient's clinical course evolves and further radiologic and laboratory data become available.   Time Spent on Admission: 1 hour  Ramond Darnell M.D. [facility] 02/24/2013, 4:28 PM Pager: [phone]  If 7PM-7AM,  please contact night-coverage [email or web address] Password [email or web address]

## 2013-02-24 NOTE — Consult Note (Signed)
[facility] Consult: 10:59 AM 02/25/2013   Referring Provider: Dr Isidoro Donning  Primary Care Physician:  Verlon Au, MD Primary Gastroenterologist:  In [facility]    Reason for Consultation:  Acute anemia.   HPI: Sydney Quinn is a 79 y.o. female.  PVD with prior mesenteric bypass to celiac and SMA. S/p partial colectomy for what sounds like ischemic colitis. 9/8 - 9/12 admission. Echocardiogram on 02/03/2013 showed normal LV function, severe left ventricular hypertrophy, mild aortic insufficiency, mild mitral stenosis/mitral regurgitation and mild biatrial enlargement, moderate tricuspid regurgitation. Had succesful TEE/DCCV of new onset A fib. Discharged on lovenox bridge to Coumadin and Amiodorone. Rhina Brackett to ED by PMD with anemia, Hgb of 7.0.  Compared to 9.7 to 11.0 during the prior admit.  INR is 1.9.  Stool is FOB +. BUN is 34 c/w 9 on 02/06/13.  She c/o weakness, fatigue.  She was back in A fib in the ED.  Stool was black 4 days after recent discharge. May have been tarry but it did not persist or recurr.  No nausea.  No abdominal pain.  Appetite not changed, tends to be decreased.  + constipation since latest admission when she started a lot of new meds. Used to have BM every day, now every 3 days. Overnight had 2 brown BMs.  FOB +.  There has been no BPR.  No nose bleeds  Cardiology has discontinued Coumadin.   Past Medical History  Diagnosis Date  . Pancreatitis     pt not aware of this dx  . Hypertension   . Myocardial infarction   . Hypothyroid   . High cholesterol   . PAF (paroxysmal atrial fibrillation)     a. 01/2013 s/p TEE/DCCV (EF 65-70%, sev conc LVH, mod TR, no thrombus).  . Collagen vascular disease   . Diabetes mellitus without complication   . Nephrolithiasis     Bil. by CT of 2012  . PVD (peripheral vascular disease)     Past Surgical History  Procedure Laterality Date  . Vascular surgery  1999     s/p mesenteric bypass.  in [facility].   Marland Kitchen Appendectomy    . Tee without cardioversion N/A 02/04/2013    Procedure: TRANSESOPHAGEAL ECHOCARDIOGRAM (TEE);  Surgeon: Vesta Mixer, MD;  Location: [facility] ENDOSCOPY;  Service: Cardiovascular;  Laterality: N/A;  . Cardioversion N/A 02/04/2013    Procedure: CARDIOVERSION;  Surgeon: Vesta Mixer, MD;  Location: [facility] ENDOSCOPY;  Service: Cardiovascular;  Laterality: N/A;  . Partial colectomy  1999    had this following mesenteric bypass for what sounds like ischemic colitis.   Marland Kitchen Splenectomy  1999    pt recalls this was done at time of colectomy.    Prior to Admission medications   Medication Sig Start Date End Date Taking? Authorizing Provider  amiodarone (PACERONE) 400 MG tablet Take 400 mg by mouth daily. 02/06/13  Yes Ok Anis, NP  aspirin 81 MG tablet Take 81 mg by mouth daily.   Yes Historical Provider, MD  Calcium Carbonate (CALCIUM 600 PO) Take 1 tablet by mouth daily.    Yes Historical Provider, MD  Echinacea 400 MG CAPS Take 1 capsule by mouth daily.    Yes Historical Provider, MD  fish oil-omega-3 fatty acids 1000 MG capsule Take 1 g by mouth daily.   Yes Historical Provider, MD  gabapentin (NEURONTIN) 100 MG capsule Take 500 mg by mouth at bedtime.   Yes Historical Provider, MD  isosorbide  mononitrate (IMDUR) 60 MG 24 hr tablet Take 60 mg by mouth daily.   Yes Historical Provider, MD  Levothyroxine Sodium 88 MCG CAPS Take by mouth daily before breakfast.   Yes Historical Provider, MD  simvastatin (ZOCOR) 40 MG tablet TAKE 1 TABLET DAILY 05/02/12  Yes Eleanore E Egan, PA-C  traMADol (ULTRAM) 50 MG tablet Take 50 mg by mouth every 6 (six) hours as needed for pain.   Yes Historical Provider, MD  warfarin (COUMADIN) 4 MG tablet Take 2 mg by mouth daily.   Yes Historical Provider, MD    Scheduled Meds: . amiodarone  400 mg Oral Daily  . gabapentin  500 mg Oral QHS  . pantoprazole (PROTONIX) IV  40 mg Intravenous Q12H  . sodium  chloride  3 mL Intravenous Q12H   Infusions: . sodium chloride 75 mL/hr (02/25/13 0008)   PRN Meds:    Allergies as of 02/24/2013 - Review Complete 02/24/2013  Allergen Reaction Noted  . Biaxin [clarithromycin] Rash 02/02/2013    Family History  Problem Relation Age of Onset  . Heart disease      No family history    History   Social History  . Marital Status: Widowed    Spouse Name: N/A    Number of Children: 2  . Years of Education: N/A   Occupational History  . Not on file.   Social History Main Topics  . Smoking status: Current Some Day Smoker -- 0.25 packs/day  . Smokeless tobacco: Not on file  . Alcohol Use: No  . Drug Use: Not on file  . Sexual Activity: Not on file   Other Topics Concern  . Not on file   Social History Narrative  . No narrative on file    REVIEW OF SYSTEMS: Constitutional: Denies fever, chills, diaphoresis, weight is stable. HEENT: Denies photophobia, eye pain, redness, hearing loss, ear pain, congestion, sore throat, rhinorrhea, sneezing, mouth sores, trouble swallowing, neck pain, neck stiffness and tinnitus.  Respiratory: Denies cough, chest tightness, and wheezing.  + DOE but no resting dyspnea.   Cardiovascular: Denies chest pain, palpitations and leg swelling.  Gastrointestinal: Denies nausea, vomiting, abdominal pain, diarrhea, constipation.  No dysphagia.  Genitourinary: Denies dysuria, urgency, frequency, hematuria, flank pain and difficulty urinating.  Musculoskeletal: Denies myalgias, back pain, joint swelling, arthralgias and gait problem. No NSAIDs Skin: Denies pallor, rash and wound.  Neurological: Denies seizures, syncope, weakness,numbness and headaches. + mild dizziness.   No balance problems, no falls.  Some memory problems, no confusion or disorientation. Hematological: Denies adenopathy. Easy bruising, personal or family bleeding history .  No previous transfusion.  Psychiatric/Behavioral: Denies suicidal ideation,  mood changes, confusion, nervousness, sleep disturbance and agitation   PHYSICAL EXAM: Vital signs in last 24 hours: Temp:  [97 F (36.1 C)-98.2 F (36.8 C)] 98.2 F (36.8 C) (10/01 0630) Pulse Rate:  [80-108] 103 (10/01 0630) Resp:  [10-24] 19 (10/01 0630) BP: (71-130)/(43-88) 98/67 mmHg (10/01 0646) SpO2:  [91 %-99 %] 93 % (10/01 0630) Weight:  [48.399 kg (106 lb 11.2 oz)-48.988 kg (108 lb)] 48.399 kg (106 lb 11.2 oz) (10/01 0630)  General: pleasant, well-groomed, thin elderly WF.  comfortable Head:  No asymmetry or swelling  Eyes:  No icterus.  Slight pallor of conjunctiva Ears:  Slightly HOH  Nose:  No discharge Mouth:  Clear, moist oral MM.  Good dentition Neck:  No mass, no JVD Lungs:  Clear bil.  Breathing not labored Heart: Irreg Irreg.  2/6 SEM.  Abdomen:  Soft, NT, ND.  No mass.  + bruits bil and mid abdomen.  No pulsatile masses.  No HSM.  Well healed long mid-line scar.   Rectal: not done.  FOB + brown stool per ED   Musc/Skeltl: no joint deformity Extremities:  No CCE.  Neurologic:  Some memory lapses but oriented x 3.  No tremor.  No limb weakness Skin:  No rash, no telangectasia, no sores Tattoos:  none Nodes:  No inginal adenopathy.   Psych:  Pleasant, not anxious or depressed.   Intake/Output from previous day: 09/30 0701 - 10/01 0700 In: 1555 [P.O.:240; I.V.:590; Blood:725] Out: 1500 [Urine:1500] Intake/Output this shift:    LAB RESULTS:  Recent Labs  02/24/13 1406 02/25/13 0715  WBC 8.5 9.1  HGB 7.0* 10.0*  HCT 21.5* 29.7*  PLT 208 191  MCV    93  BMET Lab Results  Component Value Date   NA 144 02/25/2013   NA 139 02/24/2013   NA 142 02/06/2013   K 4.7 02/25/2013   K 3.8 02/24/2013   K 3.2* 02/06/2013   CL 107 02/25/2013   CL 99 02/24/2013   CL 103 02/06/2013   CO2 29 02/25/2013   CO2 31 02/24/2013   CO2 26 02/06/2013   GLUCOSE 80 02/25/2013   GLUCOSE 107* 02/24/2013   GLUCOSE 218* 02/06/2013   BUN 27* 02/25/2013   BUN 34* 02/24/2013   BUN 9  02/06/2013   CREATININE 1.65* 02/25/2013   CREATININE 1.79* 02/24/2013   CREATININE 0.91 02/06/2013   CALCIUM 9.5 02/25/2013   CALCIUM 10.5 02/24/2013   CALCIUM 9.2 02/06/2013   LFT No results found for this basename: PROT, ALBUMIN, AST, ALT, ALKPHOS, BILITOT, BILIDIR, IBILI,  in the last 72 hours PT/INR Lab Results  Component Value Date   INR 1.77* 02/25/2013   INR 1.93* 02/24/2013   INR 1.29 02/06/2013    RADIOLOGY STUDIES: No results found.  ENDOSCOPIC STUDIES: Nothing in Epic.  All colonoscopies done at [facility] in [facility] perhaps as long as 10 years ago,   IMPRESSION: *  ABL anemia.  S/p 2 units PRBCs with excellent response *  Recent DCCV/TEE for new onset A fib. On Comadin since.  INR subtherapeutic at admission 10/29.  Coumadin discontinued for now.  *  Acute renal failure.  *  Peripheral vascular dz.  S/p mesenteric bypass 1999.  *  S/p partial colectomy 1999 for what sounds like ischemic colitis, this was following the [facility].  *  DM 2. Diet controlled.   PLAN: *  EGD today.  D/w pt and her son     LOS: 1 day   Jennye Moccasin  02/25/2013, 10:59 AM Pager: [phone]

## 2013-02-24 NOTE — ED Notes (Signed)
Recently started on coumadin and had a procedure recently and today was told to come here because hemoglobin was 7. Unknown source of blood loss.

## 2013-02-24 NOTE — ED Provider Notes (Signed)
CSN: [id number]     Arrival date & time 02/24/13  1303 History   First MD Initiated Contact with Patient 02/24/13 1347     Chief Complaint  Patient presents with  . Coagulation Disorder   (Consider location/radiation/quality/duration/timing/severity/associated sxs/prior Treatment) HPI  Patient reports she was having dyspnea on exertion 3-4 weeks ago and was found to be in atrial fibrillation. She states she had the symptoms about 2 days before she came to the hospital. She was admitted on September 8. She had a TEE done which she did not convert with IV medications and was converted to normal sinus rhythm at the time of her discharge on September 12. She was started on Coumadin and reports her last INR was about 4 days ago and was about 2.2. She reports she started having lack of energy about a week ago. She was seen by her PCP this morning and was told she had a hemoglobin of 7 and was sent to the ED. She reports she did have a black stool about 3 weeks ago but hasn't had it since. She denies any abdominal pain. She denies chest pain, shortness of breath or dyspnea on exertion now, nausea or vomiting.  PCP Dr Leavy Cella [facility] [place]  Past Medical History  Diagnosis Date  . Pancreatitis   . Hypertension   . Myocardial infarction   . Hypothyroid   . High cholesterol   . PAF (paroxysmal atrial fibrillation)     a. 01/2013 s/p TEE/DCCV (EF 65-70%, sev conc LVH, mod TR, no thrombus).  . Collagen vascular disease   . Diabetes mellitus without complication   . Nephrolithiasis     Bil. by CT of 2012  . PVD (peripheral vascular disease)    Past Surgical History  Procedure Laterality Date  . Vascular surgery  ???    by CT of 2012 has Bifurcated mesenteric graft, aortic plaque  . Appendectomy    . Tee without cardioversion N/A 02/04/2013    Procedure: TRANSESOPHAGEAL ECHOCARDIOGRAM (TEE);  Surgeon: Vesta Mixer, MD;  Location: [facility] ENDOSCOPY;  Service: Cardiovascular;   Laterality: N/A;  . Cardioversion N/A 02/04/2013    Procedure: CARDIOVERSION;  Surgeon: Vesta Mixer, MD;  Location: [facility] ENDOSCOPY;  Service: Cardiovascular;  Laterality: N/A;   Family History  Problem Relation Age of Onset  . Heart disease      No family history   History  Substance Use Topics  . Smoking status: Current Some Day Smoker -- 0.25 packs/day  . Smokeless tobacco: Not on file  . Alcohol Use: No   lives at home Lives alone   [place] History   Grav Para Term Preterm Abortions TAB SAB Ect Mult Living                 Review of Systems  All other systems reviewed and are negative.    Allergies  Biaxin  Home Medications   Current Outpatient Rx  Name  Route  Sig  Dispense  Refill  . amiodarone (PACERONE) 400 MG tablet   Oral   Take 400 mg by mouth daily.         Marland Kitchen aspirin 81 MG tablet   Oral   Take 81 mg by mouth daily.         . Calcium Carbonate (CALCIUM 600 PO)   Oral   Take 1 tablet by mouth daily.          . Echinacea 400 MG CAPS   Oral  Take 1 capsule by mouth daily.          . fish oil-omega-3 fatty acids 1000 MG capsule   Oral   Take 1 g by mouth daily.         Marland Kitchen gabapentin (NEURONTIN) 100 MG capsule   Oral   Take 500 mg by mouth at bedtime.         . isosorbide mononitrate (IMDUR) 60 MG 24 hr tablet   Oral   Take 60 mg by mouth daily.         . Levothyroxine Sodium 88 MCG CAPS   Oral   Take by mouth daily before breakfast.         . simvastatin (ZOCOR) 40 MG tablet      TAKE 1 TABLET DAILY   30 tablet   0     Needs office visit/labs!   Marland Kitchen traMADol (ULTRAM) 50 MG tablet   Oral   Take 50 mg by mouth every 6 (six) hours as needed for pain.         Marland Kitchen warfarin (COUMADIN) 4 MG tablet   Oral   Take 2 mg by mouth daily.          BP 130/85  Pulse 87  Temp(Src) 98.2 F (36.8 C) (Oral)  Resp 18  Ht 5\' 4"  (1.626 m)  Wt 108 lb (48.988 kg)  BMI 18.53 kg/m2  SpO2 95%  Vital signs normal   Physical Exam   Nursing note and vitals reviewed. Constitutional: She is oriented to person, place, and time. She appears well-developed and well-nourished.  Non-toxic appearance. She does not appear ill. No distress.  HENT:  Head: Normocephalic and atraumatic.  Right Ear: External ear normal.  Left Ear: External ear normal.  Nose: Nose normal. No mucosal edema or rhinorrhea.  Mouth/Throat: Oropharynx is clear and moist and mucous membranes are normal. No dental abscesses or edematous.  Eyes: Conjunctivae and EOM are normal. Pupils are equal, round, and reactive to light.  Pale conjunctiva  Neck: Normal range of motion and full passive range of motion without pain. Neck supple.  Cardiovascular: Normal rate and normal heart sounds.  An irregular rhythm present. Exam reveals no gallop and no friction rub.   No murmur heard. Pulmonary/Chest: Effort normal and breath sounds normal. No respiratory distress. She has no wheezes. She has no rhonchi. She has no rales. She exhibits no tenderness and no crepitus.  Abdominal: Soft. Normal appearance and bowel sounds are normal. She exhibits no distension. There is no tenderness. There is no rebound and no guarding.  Musculoskeletal: Normal range of motion. She exhibits no edema and no tenderness.  Moves all extremities well.   Neurological: She is alert and oriented to person, place, and time. She has normal strength. No cranial nerve deficit.  Skin: Skin is warm, dry and intact. No rash noted. No erythema. There is pallor.  Psychiatric: She has a normal mood and affect. Her speech is normal and behavior is normal. Her mood appears not anxious.    ED Course  Procedures (including critical care time)  Medications  0.9 %  sodium chloride infusion (not administered)  sodium chloride 0.9 % bolus 500 mL (500 mLs Intravenous New Bag/Given 02/24/13 1552)     Pt prepared for blood transfusion, 2 units ordered in ED.  15:53 Dr Isidoro Donning, admit to tele, team 10   Labs  Review Results for orders placed during the hospital encounter of 02/24/13  CBC  Result Value Range   WBC 8.5  4.0 - 10.5 K/uL   RBC 2.18 (*) 3.87 - 5.11 MIL/uL   Hemoglobin 7.0 (*) 12.0 - 15.0 g/dL   HCT 16.1 (*) 09.6 - 04.5 %   MCV 98.6  78.0 - 100.0 fL   MCH 32.1  26.0 - 34.0 pg   MCHC 32.6  30.0 - 36.0 g/dL   RDW 40.9 (*) 81.1 - 91.4 %   Platelets 208  150 - 400 K/uL  BASIC METABOLIC PANEL      Result Value Range   Sodium 139  135 - 145 mEq/L   Potassium 3.8  3.5 - 5.1 mEq/L   Chloride 99  96 - 112 mEq/L   CO2 31  19 - 32 mEq/L   Glucose, Bld 107 (*) 70 - 99 mg/dL   BUN 34 (*) 6 - 23 mg/dL   Creatinine, Ser 7.82 (*) 0.50 - 1.10 mg/dL   Calcium 95.6  8.4 - 21.3 mg/dL   GFR calc non Af Amer 25 (*) >90 mL/min   GFR calc Af Amer 29 (*) >90 mL/min  PROTIME-INR      Result Value Range   Prothrombin Time 21.5 (*) 11.6 - 15.2 seconds   INR 1.93 (*) 0.00 - 1.49  OCCULT BLOOD, POC DEVICE      Result Value Range   Fecal Occult Bld POSITIVE (*) NEGATIVE  POCT I-STAT TROPONIN I      Result Value Range   Troponin i, poc 0.05  0.00 - 0.08 ng/mL   Comment 3           SAMPLE TO BLOOD BANK      Result Value Range   Blood Bank Specimen SAMPLE AVAILABLE FOR TESTING     Sample Expiration 02/25/2013    TYPE AND SCREEN      Result Value Range   ABO/RH(D) A POS     Antibody Screen NEG     Sample Expiration 02/27/2013    PREPARE RBC (CROSSMATCH)      Result Value Range   Order Confirmation ORDER PROCESSED BY BLOOD BANK     Laboratory interpretation all normal except new anemia, new renal insuffic c/w dehydration, + hemoccult, subtherapeutic INR    Date: 02/24/2013  Rate: 96  Rhythm: atrial fibrillation  QRS Axis: normal  Intervals: normal  ST/T Wave abnormalities: nonspecific ST/T changes  Conduction Disutrbances:right bundle branch block, left anterior fascicular block and LVH  Narrative Interpretation:   Old EKG Reviewed: changes noted from 02/18/2013 was in AFib with rate  115      MDM  patiently recently diagnosed with atrial fib with fast ventricular response. She is undergone cardioversion to normal sinus rhythm however she presents to the ED today with return of her atrial fib although her rate is controlled and patient is asymptomatic (she denies having chest pain or shortness of breath). She was started on Coumadin and did have an episode of black stool consistent with GI bleeding. Her stool is Hemoccult positive in the ED and she is also anemic with a hemoglobin of 7. Time of discharge her hemoglobin had been 11. Patient is being admitted for blood transfusion, and probable GI evaluation to find the source of her bleeding. Her atrial fibrillation currently is rate controlled but has recurred. Patient also has a new renal insufficiency that appears to be dehydration in origin. She was given a small bolus of IV fluids in the ED.    1. Anemia   2.  Atrial fibrillation   3. Renal insufficiency   4. GI bleeding   5. Dehydration   6. DOE (dyspnea on exertion)     Plan admission   Devoria Albe, MD, FACEP  CRITICAL CARE Performed by: Devoria Albe L Total critical care time: 31 min Critical care time was exclusive of separately billable procedures and treating other patients. Critical care was necessary to treat or prevent imminent or life-threatening deterioration. Critical care was time spent personally by me on the following activities: development of treatment plan with patient and/or surrogate as well as nursing, discussions with consultants, evaluation of patient's response to treatment, examination of patient, obtaining history from patient or surrogate, ordering and performing treatments and interventions, ordering and review of laboratory studies, ordering and review of radiographic studies, pulse oximetry and re-evaluation of patient's condition.     Ward Givens, MD 02/24/13 2013

## 2013-02-25 ENCOUNTER — Encounter (HOSPITAL_COMMUNITY): Payer: Self-pay | Admitting: Physician Assistant

## 2013-02-25 ENCOUNTER — Encounter (HOSPITAL_COMMUNITY)
Admission: EM | Disposition: A | Discharge status: Home / Self Care | Payer: Self-pay | Source: Home / Self Care | Attending: Internal Medicine

## 2013-02-25 DIAGNOSIS — T45511A Poisoning by anticoagulants, accidental (unintentional), initial encounter: Secondary | ICD-10-CM

## 2013-02-25 DIAGNOSIS — T458X1A Poisoning by other primarily systemic and hematological agents, accidental (unintentional), initial encounter: Secondary | ICD-10-CM

## 2013-02-25 DIAGNOSIS — K31819 Angiodysplasia of stomach and duodenum without bleeding: Secondary | ICD-10-CM

## 2013-02-25 HISTORY — PX: ESOPHAGOGASTRODUODENOSCOPY: SHX5428

## 2013-02-25 HISTORY — PX: ARGON LASER APPLICATION: SHX5727

## 2013-02-25 LAB — TYPE AND SCREEN
ABO/RH(D): A POS
Antibody Screen: NEGATIVE
Unit division: 0

## 2013-02-25 LAB — BASIC METABOLIC PANEL
CO2: 29 mEq/L (ref 19–32)
Calcium: 9.5 mg/dL (ref 8.4–10.5)
Chloride: 107 mEq/L (ref 96–112)
Creatinine, Ser: 1.65 mg/dL — ABNORMAL HIGH (ref 0.50–1.10)
GFR calc non Af Amer: 28 mL/min — ABNORMAL LOW (ref >90)
Glucose, Bld: 80 mg/dL (ref 70–99)

## 2013-02-25 LAB — CBC
HCT: 29.7 % — ABNORMAL LOW (ref 36.0–46.0)
MCH: 31.3 pg (ref 26.0–34.0)
MCHC: 33.7 g/dL (ref 30.0–36.0)
MCV: 93.1 fL (ref 78.0–100.0)
Platelets: 191 10*3/uL (ref 150–400)
RDW: 18.5 % — ABNORMAL HIGH (ref 11.5–15.5)
WBC: 9.1 10*3/uL (ref 4.0–10.5)

## 2013-02-25 LAB — PROTIME-INR: INR: 1.77 — ABNORMAL HIGH (ref 0.00–1.49)

## 2013-02-25 SURGERY — EGD (ESOPHAGOGASTRODUODENOSCOPY)
Anesthesia: Moderate Sedation

## 2013-02-25 MED ORDER — INFLUENZA VAC SPLIT QUAD 0.5 ML IM SUSP
0.5000 mL | INTRAMUSCULAR | Status: AC
  Administered 2013-02-26: 0.5 mL via INTRAMUSCULAR
  Filled 2013-02-25: qty 0.5

## 2013-02-25 MED ORDER — BUTAMBEN-TETRACAINE-BENZOCAINE 2-2-14 % EX AERO
INHALATION_SPRAY | CUTANEOUS | Status: DC | PRN
  Administered 2013-02-25: 2 via TOPICAL

## 2013-02-25 MED ORDER — MIDAZOLAM HCL 10 MG/2ML IJ SOLN
INTRAMUSCULAR | Status: DC | PRN
  Administered 2013-02-25 (×2): 2 mg via INTRAVENOUS

## 2013-02-25 MED ORDER — MIDAZOLAM HCL 5 MG/ML IJ SOLN
INTRAMUSCULAR | Status: AC
  Filled 2013-02-25: qty 2

## 2013-02-25 MED ORDER — FENTANYL CITRATE 0.05 MG/ML IJ SOLN
INTRAMUSCULAR | Status: DC | PRN
  Administered 2013-02-25 (×2): 25 ug via INTRAVENOUS

## 2013-02-25 MED ORDER — SODIUM CHLORIDE 0.9 % IJ SOLN
INTRAMUSCULAR | Status: DC | PRN
  Administered 2013-02-25: 13:00:00

## 2013-02-25 MED ORDER — FENTANYL CITRATE 0.05 MG/ML IJ SOLN
INTRAMUSCULAR | Status: AC
  Filled 2013-02-25: qty 2

## 2013-02-25 MED ORDER — PANTOPRAZOLE SODIUM 40 MG PO TBEC
40.0000 mg | DELAYED_RELEASE_TABLET | Freq: Every day | ORAL | Status: DC
  Administered 2013-02-26 – 2013-03-03 (×6): 40 mg via ORAL
  Filled 2013-02-25 (×6): qty 1

## 2013-02-25 MED ORDER — SODIUM CHLORIDE 0.9 % IV SOLN
INTRAVENOUS | Status: AC
  Administered 2013-02-25: 75 mL/h via INTRAVENOUS

## 2013-02-25 NOTE — Progress Notes (Signed)
   Subjective:  Denies CP or dyspnea   Objective:  Filed Vitals:   02/24/13 2315 02/24/13 2358 02/25/13 0630 02/25/13 0646  BP: 114/80 104/77 71/51 98/67   Pulse: 95 98 103   Temp: 98 F (36.7 C) 98.1 F (36.7 C) 98.2 F (36.8 C)   TempSrc: Oral Oral Oral   Resp: 18 18 19    Height:      Weight:   106 lb 11.2 oz (48.399 kg)   SpO2: 95% 93% 93%     Intake/Output from previous day:  Intake/Output Summary (Last 24 hours) at 02/25/13 0713 Last data filed at 02/25/13 0700  Gross per 24 hour  Intake   1555 ml  Output   1500 ml  Net     55 ml    Physical Exam: Physical exam: Well-developed frail in no acute distress.  Skin is warm and dry.  HEENT is normal.  Neck is supple.  Chest with diminished BS bases Cardiovascular exam irregular Abdominal exam nontender or distended. No masses palpated. Extremities show no edema. neuro grossly intact    Lab Results: Basic Metabolic Panel:  Recent Labs  16/10/96 1406  NA 139  K 3.8  CL 99  CO2 31  GLUCOSE 107*  BUN 34*  CREATININE 1.79*  CALCIUM 10.5   CBC:  Recent Labs  02/24/13 1406  WBC 8.5  HGB 7.0*  HCT 21.5*  MCV 98.6  PLT 208  Telemetry - atrial flutter  Assessment/Plan:  1 atrial fibrillation/flutter-the patient remains in atrial flutter this AM. Her rate is 90 to 110. Would continue amiodarone for rate control. She is mildly tachycardic most likely related to significant anemia. Coumadin and ASA DCed because of acute anemia most likely from GI blood loss. There is risk of an embolic event given the recurrent atrial arrhythmia and recent DCCV. However given probable GI bleed and anemia we have no other choice. Once etiology of bleed is addressed we will resume anticoagulation. Could repeat cardioversion afterwards now that amiodarone is completely in her system. She was symptomatic with her atrial fibrillation previously and this certainly could be contributing to her weakness and dyspnea in addition to her  acute blood loss anemia.  2 acute blood loss anemia-most likely GI in etiology given history of melena 3 days following discharge. GI to evaluate. Continue PPI. Transfuse as needed. Follow Hgb. Coumadin and ASA DCed. 3 acute renal failure-BUN and creatinine are increased compared to recent discharge. Possibly related hypoperfusion related to anemia/blood loss. Follow renal function.  4 tobacco abuse-patient counseled on discontinuing.   Olga Millers 02/25/2013, 7:13 AM

## 2013-02-25 NOTE — Op Note (Signed)
[name] [address] [place], [address]   ENDOSCOPY PROCEDURE REPORT  PATIENT: Sydney Quinn, Sydney Quinn  MR#: [id number] BIRTHDATE: 02/28/1930 , 83  yrs. old GENDER: Female ENDOSCOPIST: Beverley Fiedler, MD REFERRED BY:  [facility] PROCEDURE DATE:  02/25/2013 PROCEDURE:  EGD w/ ablation and EGD w/ directed submucosal injection(s), any substance ASA CLASS:     Class III INDICATIONS:  Acute post hemorrhagic anemia.   Melena. MEDICATIONS: These medications were titrated to patient response per physician's verbal order, Fentanyl 50 mcg IV, Versed 4 mg IV, and Epinephrine 1 cc TOPICAL ANESTHETIC: Cetacaine Spray  DESCRIPTION OF PROCEDURE: After the risks benefits and alternatives of the procedure were thoroughly explained, informed consent was obtained.  The Pentax Gastroscope [id number] endoscope was introduced through the mouth and advanced to the second portion of the duodenum. Without limitations.  The instrument was slowly withdrawn as the mucosa was fully examined.     ESOPHAGUS: The mucosa of the esophagus appeared normal.   A normal Z-line was observed 40 cm from the incisors.  STOMACH: A 4-5 mm angioectasia was found in the gastric body.  A 1 ml 1:10,000 Epinephrine solution injection was given prior to APC ablation. APC ablation was then performed (1L/min, 40W) with success.  2 hemostatic clips were placed after ablation given current INR and need for anticoagulation. There was no blood loss from maneuver.   The stomach otherwise appeared normal.  DUODENUM: The duodenal mucosa showed no abnormalities in the bulb and second portion of the duodenum.  Retroflexed views revealed no abnormalities.     The scope was then withdrawn from the patient and the procedure completed.  COMPLICATIONS: There were no complications.  ENDOSCOPIC IMPRESSION: 1.   The mucosa of the esophagus appeared normal 2.   Normal Z-line was observed 40 cm from the  incisors 3.   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   The stomach otherwise appeared normal 5.   The duodenal mucosa showed no abnormalities in the bulb and second portion of the duodenum  RECOMMENDATIONS: 1.  Daily PPI for 1 month 2.  Monitor Hgb/HCT to ensure normalization 3.  Consider colonoscopy during this hospitalization while warfarin is on hold to rule out lower GI source of recent blood loss  eSigned:  Beverley Fiedler, MD 02/25/2013 1:18 PM   CC:The Patient  PATIENT NAME:  Sydney Quinn, Sydney Quinn MR#: [id number]

## 2013-02-25 NOTE — Consult Note (Signed)
Patient seen, examined, and I agree with the above documentation, including the assessment and plan. Agree with EGD today to evaluate acute blood loss anemia.  Of note her INR is 1.77, platelet count is normal The nature of the procedure, as well as the risks, benefits, and alternatives were carefully and thoroughly reviewed with the patient. Ample time for discussion and questions allowed. The patient understood, was satisfied, and agreed to proceed.  Further recommendations after procedure

## 2013-02-25 NOTE — Progress Notes (Addendum)
Pt rec back from endo. Pt o4x, Pt put on 2L d/t stat 90-91%. Bp 93/70. Pt place on freq VS 85minx4. Will continue to monitor

## 2013-02-25 NOTE — Evaluation (Signed)
Occupational Therapy Evaluation Patient Details Name: Sydney Quinn MRN: [id number] DOB: 01-27-1930 Today's Date: 02/25/2013 Time: 6962-9528 OT Time Calculation (min): 20 min  OT Assessment / Plan / Recommendation History of present illness 79 year old female with history of hypertension, CAD, atrial fibrillation s/p cardioversion on 02/04/2013, was discharged on 02/06/13 from [facility] on amiodarone and Coumadin. Patient presented back to ER with increasing generalized weakness, fatigue and "low energy". Patient lives at home by herself. Patient was accompanied by her son. Patient reports that 3 days after she was discharged, she had dark black melanotic stool but she did not pay much attention at that time. She usually has constipation. However in the last 2-3 days, she's been having poor appetite, increasing generalized weakness, fatigue and dyspnea on exertion.   Clinical Impression    Pt did well during evaluation. She is mildly unsteady, so recommending family stay with her for a few days to ensure safety. Pt safe to d/c home, from OT standpoint with family to assist 24/7 for a few days. Educated pt and her son.       OT Assessment  Patient does not need any further OT services    Follow Up Recommendations  No OT follow up;Supervision/Assistance - 24 hour    Barriers to Discharge      Equipment Recommendations  None recommended by OT    Recommendations for Other Services    Frequency       Precautions / Restrictions Precautions Precautions: Fall Restrictions Weight Bearing Restrictions: No   Pertinent Vitals/Pain No pain reported. HR in 120's at beginning of session, but trended down.     ADL  Eating/Feeding: Independent Where Assessed - Eating/Feeding: Chair Grooming: Wash/dry hands;Supervision/safety Where Assessed - Grooming: Supported standing Upper Body Bathing: Set up Where Assessed - Upper Body Bathing: Supported sitting Lower Body Bathing: Supervision/safety;Set  up Where Assessed - Lower Body Bathing: Unsupported sit to stand Upper Body Dressing: Set up Where Assessed - Upper Body Dressing: Supported sitting Lower Body Dressing: Set up;Supervision/safety Where Assessed - Lower Body Dressing: Unsupported sit to stand Toilet Transfer: Min Agricultural engineer Method: Sit to stand (Min guard-stand to sit and Supervision-sit to stand) Acupuncturist: Comfort height toilet Toileting - Clothing Manipulation and Hygiene: Supervision/safety Where Assessed - Engineer, mining and Hygiene: Sit to stand from 3-in-1 or toilet Tub/Shower Transfer Method: Not assessed Equipment Used: Gait belt Transfers/Ambulation Related to ADLs: Min guard for ambulation. Min guard/supervision for transfers. ADL Comments: Educated pt to stand in front of bed/chair to pull up lower body clothing and to sit to don over feet for safety. Educated to have someone with her to get in/out of shower and to not hold onto towel rack but rather hold onto wall. Educated to sit to bathe as she is a little unsteady walking and the shower is slick, so for safety. Educated on safe shoe wear and to have rugs picked up.  Spoke with family about using lawn chair for shower as pt is not interested in getting a shower chair.     OT Diagnosis:    OT Problem List:   OT Treatment Interventions:     OT Goals(Current goals can be found in the care plan section) Acute Rehab OT Goals Patient Stated Goal: go home  Visit Information  Last OT Received On: 02/25/13 Assistance Needed: +1 History of Present Illness: 79 year old female with history of hypertension, CAD, atrial fibrillation s/p cardioversion on 02/04/2013, was discharged on 02/06/13 from [facility] on amiodarone and Coumadin.  Patient presented back to ER with increasing generalized weakness, fatigue and "low energy". Patient lives at home by herself. Patient was accompanied by her son. Patient reports that 3  days after she was discharged, she had dark black melanotic stool but she did not pay much attention at that time. She usually has constipation. However in the last 2-3 days, she's been having poor appetite, increasing generalized weakness, fatigue and dyspnea on exertion.       Prior Functioning     Home Living Family/patient expects to be discharged to:: Private residence Living Arrangements: Alone Available Help at Discharge: Family;Available PRN/intermittently Type of Home: House Home Access: Level entry Home Layout: One level Home Equipment: None Prior Function Level of Independence: Independent Communication Communication: No difficulties Dominant Hand: Right         Vision/Perception     Cognition  Cognition Arousal/Alertness: Awake/alert Behavior During Therapy: WFL for tasks assessed/performed Overall Cognitive Status: Within Functional Limits for tasks assessed    Extremity/Trunk Assessment Upper Extremity Assessment Upper Extremity Assessment: Overall WFL for tasks assessed     Mobility Bed Mobility Bed Mobility: Not assessed Transfers Transfers: Sit to Stand;Stand to Sit Sit to Stand: 5: Supervision;From chair/3-in-1;From toilet Stand to Sit: 4: Min guard;5: Supervision;To chair/3-in-1;To toilet Details for Transfer Assistance: Min guard to toilet. Cues to back all the way to toilet until legs touch before taking down underwear for safety.     Exercise     Balance     End of Session OT - End of Session Equipment Utilized During Treatment: Gait belt;Oxygen (used oxgyen part of session) Activity Tolerance: Patient tolerated treatment well Patient left: in chair;with call bell/phone within reach;with family/visitor present Nurse Communication: Other (comment) (pt requesting water)  GO     Earlie Raveling OTR/L [phone] 02/25/2013, 5:35 PM

## 2013-02-25 NOTE — Progress Notes (Signed)
[facility] PROGRESS NOTE  Assessment/Plan: Angiodysplasia of stomach/GI bleed/ Acute blood loss anemia: - s/p EGD 10.01.2014: Angioectasia in the proximal gastric body; Epinephrine  injection was given to control bleeding; APC ablation, and 2 hemostatic clips. - check CBC in am. - ? When to resume anticoagulation.   Acute renal insufficiency - most likely pre-renal start IV fluid, b-met in am.   A-fib - rate control. - when to restart coumadin per GI.   Code Status: full Family Communication: none  Disposition Plan:  inpatinet   Consultants:  GI  Procedures:  EGD  Antibiotics: none  HPI/Subjective: Feel sleepy, s/p EGD  Objective: Filed Vitals:   02/25/13 1305 02/25/13 1320 02/25/13 1330 02/25/13 1400  BP: 118/72 112/100 113/91 111/75  Pulse:  103 108 113  Temp:  98.6 F (37 C)    TempSrc:  Oral    Resp: 10 14 10 13   Height:      Weight:      SpO2: 96% 99% 98% 89%    Intake/Output Summary (Last 24 hours) at 02/25/13 1435 Last data filed at 02/25/13 1317  Gross per 24 hour  Intake   1555 ml  Output   1500 ml  Net     55 ml   Filed Weights   02/24/13 1317 02/24/13 1720 02/25/13 0630  Weight: 48.988 kg (108 lb) 48.6 kg (107 lb 2.3 oz) 48.399 kg (106 lb 11.2 oz)    Exam:  General: Alert, awake, oriented x3, in no acute distress.  HEENT: No bruits, no goiter.  Heart: Regular rate and rhythm, without murmurs, rubs, gallops.  Lungs: Good air movement, bilateral air movement.  Abdomen: Soft, nontender, nondistended, positive bowel sounds.  Neuro: Grossly intact, nonfocal.   Data Reviewed: Basic Metabolic Panel:  Recent Labs Lab 02/24/13 1406 02/25/13 0715  NA 139 144  K 3.8 4.7  CL 99 107  CO2 31 29  GLUCOSE 107* 80  BUN 34* 27*  CREATININE 1.79* 1.65*  CALCIUM 10.5 9.5   Liver Function Tests: No results found for this basename: AST, ALT, ALKPHOS, BILITOT, PROT, ALBUMIN,  in the last 168 hours No results found for this basename:  LIPASE, AMYLASE,  in the last 168 hours No results found for this basename: AMMONIA,  in the last 168 hours CBC:  Recent Labs Lab 02/24/13 1406 02/25/13 0715  WBC 8.5 9.1  HGB 7.0* 10.0*  HCT 21.5* 29.7*  MCV 98.6 93.1  PLT 208 191   Cardiac Enzymes: No results found for this basename: CKTOTAL, CKMB, CKMBINDEX, TROPONINI,  in the last 168 hours BNP (last 3 results) No results found for this basename: PROBNP,  in the last 8760 hours CBG: No results found for this basename: GLUCAP,  in the last 168 hours  No results found for this or any previous visit (from the past 240 hour(s)).   Studies: No results found.  Scheduled Meds: . amiodarone  400 mg Oral Daily  . gabapentin  500 mg Oral QHS  . [START ON 02/26/2013] pantoprazole  40 mg Oral Q0600  . sodium chloride  3 mL Intravenous Q12H   Continuous Infusions: . sodium chloride 75 mL/hr (02/25/13 0008)     Marinda Elk  [facility] Pager [phone]. If 8PM-8AM, please contact night-coverage at [email or web address], password [facility] 02/25/2013, 2:35 PM  LOS: 1 day

## 2013-02-25 NOTE — Progress Notes (Signed)
I spoke to the patient and her son (by phone), Francisco Capuchin, to discuss inpatient colonoscopy.  The patient does not wish to proceed with inpatient colonoscopy at this time. Her son states that he anticipated this would be her opinion.  I discussed how colonoscopy would be to rule out a colonic source for recent blood loss, and the fact that I cannot be 100% sure the gastric angioectasia completely explains her recent melena and acute blood loss anemia. I recommend that her hemoglobin is monitored closely with her primary care physician as an outpatient.  If it fails to normalize after treatment of gastric angioectasia, or should she have recurrent melena or GI bleeding, then colonoscopy can be rediscussed and considered with the patient and family.  Will advance diet to regular.  Okay to resume warfarin.  If Hgb stable in the morning, pt okay to discharge from GI standpoint.

## 2013-02-25 NOTE — Evaluation (Signed)
Physical Therapy Evaluation Patient Details Name: Sydney Quinn MRN: [id number] DOB: [birth year] Today's Date: 02/25/2013 Time: 4098-1191 PT Time Calculation (min): 18 min  PT Assessment / Plan / Recommendation History of Present Illness  79 year old female with history of hypertension, CAD, atrial fibrillation s/p cardioversion on 02/04/2013, was discharged on 02/06/13 from [facility] on amiodarone and Coumadin. Patient presented back to ER with increasing generalized weakness, fatigue and "low energy". Patient lives at home by herself. Patient was accompanied by her son. Patient reports that 3 days after she was discharged, she had dark black melanotic stool but she did not pay much attention at that time. She usually has constipation. However in the last 2-3 days, she's been having poor appetite, increasing generalized weakness, fatigue and dyspnea on exertion.  Clinical Impression  Pt functioning near baseline. Once medically stable pt safe to d/c home with family to stay a couple of nights to ensure pt safe at home prior to returning to living alone.    PT Assessment  Patient needs continued PT services    Follow Up Recommendations  No PT follow up    Does the patient have the potential to tolerate intense rehabilitation      Barriers to Discharge        Equipment Recommendations  None recommended by PT    Recommendations for Other Services     Frequency Min 2X/week    Precautions / Restrictions Precautions Precautions: Fall Restrictions Weight Bearing Restrictions: No   Pertinent Vitals/Pain Denies pain      Mobility  Bed Mobility Bed Mobility: Supine to Sit Supine to Sit: 6: Modified independent (Device/Increase time);HOB elevated Details for Bed Mobility Assistance: pt with safe technique Transfers Transfers: Sit to Stand;Stand to Sit Sit to Stand: 4: Min guard;With upper extremity assist;From bed Stand to Sit: 5: Supervision;With upper extremity assist;To  chair/3-in-1 Details for Transfer Assistance: safe technique Ambulation/Gait Ambulation/Gait Assistance: 4: Min guard Ambulation Distance (Feet): 165 Feet Assistive device: None Ambulation/Gait Assistance Details: mildy unsteady. Pt reports "I feel weak b/c I haven't eaten." no significant LOB however shaky Gait Pattern: Step-through pattern;Decreased stride length Gait velocity: cautious/guarded Stairs: No    Exercises     PT Diagnosis: Generalized weakness;Difficulty walking  PT Problem List: Decreased strength;Decreased activity tolerance;Decreased mobility PT Treatment Interventions: DME instruction;Gait training;Functional mobility training;Therapeutic activities;Therapeutic exercise     PT Goals(Current goals can be found in the care plan section) Acute Rehab PT Goals Patient Stated Goal: home today PT Goal Formulation: With patient Potential to Achieve Goals: Good  Visit Information  Last PT Received On: 02/25/13 Assistance Needed: +1 History of Present Illness: 79 year old female with history of hypertension, CAD, atrial fibrillation s/p cardioversion on 02/04/2013, was discharged on 02/06/13 from [facility] on amiodarone and Coumadin. Patient presented back to ER with increasing generalized weakness, fatigue and "low energy". Patient lives at home by herself. Patient was accompanied by her son. Patient reports that 3 days after she was discharged, she had dark black melanotic stool but she did not pay much attention at that time. She usually has constipation. However in the last 2-3 days, she's been having poor appetite, increasing generalized weakness, fatigue and dyspnea on exertion.       Prior Functioning  Home Living Family/patient expects to be discharged to:: Private residence Living Arrangements: Alone Available Help at Discharge: Family;Available PRN/intermittently Type of Home: House Home Access: Level entry Home Layout: One level Home Equipment: None Additional  Comments: sons can spend a couple of nights if needed  Prior Function Level of Independence: Independent Communication Communication: No difficulties Dominant Hand: Right    Cognition  Cognition Arousal/Alertness: Awake/alert Behavior During Therapy: WFL for tasks assessed/performed Overall Cognitive Status: Within Functional Limits for tasks assessed    Extremity/Trunk Assessment Upper Extremity Assessment Upper Extremity Assessment: Overall WFL for tasks assessed Lower Extremity Assessment Lower Extremity Assessment: Generalized weakness Cervical / Trunk Assessment Cervical / Trunk Assessment: Normal   Balance    End of Session PT - End of Session Activity Tolerance: Patient limited by fatigue Patient left: in chair;with call bell/phone within reach;with family/visitor present Nurse Communication: Mobility status  GP     Marcene Brawn 02/25/2013, 9:30 AM Lewis Shock, PT, DPT Pager #: [phone] Office #: [phone]

## 2013-02-26 ENCOUNTER — Encounter (HOSPITAL_COMMUNITY): Payer: Self-pay | Admitting: Internal Medicine

## 2013-02-26 MED ORDER — APIXABAN 2.5 MG PO TABS: 2.5000 mg | ORAL_TABLET | Freq: Two times a day (BID) | ORAL | Status: DC

## 2013-02-26 MED ORDER — APIXABAN 2.5 MG PO TABS
2.5000 mg | ORAL_TABLET | Freq: Two times a day (BID) | ORAL | Status: DC
  Administered 2013-02-26 – 2013-03-03 (×11): 2.5 mg via ORAL
  Filled 2013-02-26 (×12): qty 1

## 2013-02-26 MED ORDER — METOPROLOL TARTRATE 12.5 MG HALF TABLET
12.5000 mg | ORAL_TABLET | Freq: Two times a day (BID) | ORAL | Status: DC
  Administered 2013-02-26 – 2013-02-27 (×4): 12.5 mg via ORAL
  Filled 2013-02-26 (×6): qty 1

## 2013-02-26 NOTE — Discharge Summary (Addendum)
Physician Discharge Summary  Sydney Quinn ZOX:[id number] DOB: 1930-02-28 DOA: 02/24/2013  PCP: Verlon Au, MD  Admit date: 02/24/2013 Discharge date: 03/03/2013  Time spent: 35 minutes  Recommendations for Outpatient Follow-up:  1. Follow up with GI in 3 week. 2. - follow up with card in 2 weeks CBC at this time.  Discharge Diagnoses:  Principal Problem:   GI bleed Active Problems:   A-fib   High cholesterol   Acute renal insufficiency   Warfarin-induced coagulopathy   Acute blood loss anemia   Angiodysplasia of stomach   Discharge Condition: stable  Diet recommendation: heart healthy  Filed Weights   03/01/13 0613 03/02/13 0504 03/03/13 0500  Weight: 48.988 kg (108 lb) 49.714 kg (109 lb 9.6 oz) 52.028 kg (114 lb 11.2 oz)    History of present illness:  79 year old female with history of hypertension, CAD, atrial fibrillation s/p cardioversion on 02/04/2013, was discharged on 02/06/13 from [facility] on amiodarone and Coumadin. Patient presented back to ER with increasing generalized weakness, fatigue and "low energy". Patient lives at home by herself. Patient was accompanied by her son. Patient reports that 3 days after she was discharged, she had dark black melanotic stool but she did not pay much attention at that time. She usually has constipation. However in the last 2-3 days, she's been having poor appetite, increasing generalized weakness, fatigue and dyspnea on exertion.  ER workup showed hemoglobin of 7.0 with hematocrit of 21.5, on 9/10 hemoglobin was 11. BUN 34, creatinine 1.7, (on 9/12 BUN 9, creatinine 0.9), INR 1.93. FOBT positive   Hospital Course:  Angiodysplasia of stomach/GI bleed/ Acute blood loss anemia:  - s/p EGD 10.01.2014: Angioectasia in the proximal gastric body; Epinephrine injection was given to control bleeding; APC ablation, and 2 hemostatic clips.  - S/p 2 units transfusion. HBG 10. - resume apixiban, risk and benefits d/w patient.  - no sign  of overt bleeding.   Acute renal insufficiency  - most likely pre-renal start IV fluid.  - improving with IV fluids..   A-fib  - rate control.  Symptomatic with orthopenea. - TEE with DCCV on 10.6.2014 - contapixiban   Procedures:  EGD  Consultations:  GI  Discharge Exam: Filed Vitals:   03/03/13 1054  BP: 101/55  Pulse: 77  Temp:   Resp:     General: A&O x3 Cardiovascular: RRR Respiratory: good air movement CTA B/L  Discharge Instructions      Discharge Orders   Future Appointments Provider Department Dept Phone   03/11/2013 12:30 PM Lewayne Bunting, MD [facility] [phone]   04/06/2013 2:30 PM Beverley Fiedler, MD [facility] [phone]   Future Orders Complete By Expires   Diet - low sodium heart healthy  As directed    Diet - low sodium heart healthy  As directed    Increase activity slowly  As directed    Increase activity slowly  As directed        Medication List    STOP taking these medications       warfarin 4 MG tablet  Commonly known as:  COUMADIN      TAKE these medications       amiodarone 400 MG tablet  Commonly known as:  PACERONE  Cont 400 mg daily for 2 weeks then 200 mg daily     apixaban 2.5 MG Tabs tablet  Commonly known as:  ELIQUIS  Take 1 tablet (2.5 mg total) by mouth 2 (two) times daily.  aspirin 81 MG tablet  Take 81 mg by mouth daily.     CALCIUM 600 PO  Take 1 tablet by mouth daily.     Echinacea 400 MG Caps  Take 1 capsule by mouth daily.     fish oil-omega-3 fatty acids 1000 MG capsule  Take 1 g by mouth daily.     gabapentin 100 MG capsule  Commonly known as:  NEURONTIN  Take 500 mg by mouth at bedtime.     isosorbide mononitrate 60 MG 24 hr tablet  Commonly known as:  IMDUR  Take 60 mg by mouth daily.     Levothyroxine Sodium 88 MCG Caps  Take by mouth daily before breakfast.     simvastatin 40 MG tablet  Commonly known as:  ZOCOR  TAKE 1 TABLET DAILY      traMADol 50 MG tablet  Commonly known as:  ULTRAM  Take 50 mg by mouth every 6 (six) hours as needed for pain.       Allergies  Allergen Reactions  . Biaxin [Clarithromycin] Rash   Follow-up Information   Follow up with Beverley Fiedler, MD On 04/06/2013. (@ 2:30 P.M. Spoke w. Neysa Bonito (This is the earliest appt available, patient was on a list for an earlier appt. if a cancellation occurs) )    Specialty:  Gastroenterology   Contact information:   [address] [place] [address] [phone]        The results of significant diagnostics from this hospitalization (including imaging, microbiology, ancillary and laboratory) are listed below for reference.    Significant Diagnostic Studies: Dg Chest Port 1 View  02/04/2013   *RADIOLOGY REPORT*  Clinical Data: Shortness of breath  PORTABLE CHEST - 1 VIEW  Comparison: 02/02/2013  Findings: Cardiac enlargement.  Since the previous study, there is progression of pulmonary vascular congestion with increasing edema, increasing small bilateral pleural effusions, and developing basilar atelectasis.  Calcified and tortuous aorta.  No pneumothorax.  Degenerative changes in the spine.  Vascular calcifications.  IMPRESSION: Cardiac enlargement with progression of pulmonary vascular congestion, pulmonary edema, and bilateral pleural effusions with basilar atelectasis.   Original Report Authenticated By: Burman Nieves, M.D.   Dg Chest Port 1 View  02/02/2013   *RADIOLOGY REPORT*  Clinical Data: Atrial fibrillation.  PORTABLE CHEST - 1 VIEW  Comparison: None.  Findings: Single view of the chest demonstrates enlargement of the cardiac silhouette.  Prominent lung markings, particularly at the lung bases.  Trachea is midline.  Bony thorax is intact.  Negative for a pneumothorax.  IMPRESSION: Cardiomegaly and evidence for interstitial pulmonary edema.   Original Report Authenticated By: Richarda Overlie, M.D.   Ct Angio Chest Aortic Dissect W &/or  W/o  02/05/2013   *RADIOLOGY REPORT*  Clinical Data:  Extensive atherosclerotic plaquing in the descending aortic arch by transesophageal echo concerning for ulcerated plaque formation or dissection.  History of mesenteric bypass  CT ANGIOGRAPHY CHEST, ABDOMEN AND PELVIS  Technique:  Multidetector CT imaging through the chest, abdomen and pelvis was performed using the standard protocol during bolus administration of intravenous contrast.  Multiplanar reconstructed images including MIPs were obtained and reviewed to evaluate the vascular anatomy.  Contrast: 80mL OMNIPAQUE IOHEXOL 350 MG/ML SOLN  Comparison:  09/26/2010, 07/20/2010  CTA CHEST  Findings:  Extensive heavy calcification of the major branch vessels and the entire thoracic aorta.  Negative for acute dissection.  Diffuse mural thickening of the descending aorta.  No large ulcerated plaque formation appreciated.  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  Brachiocephalic and left common carotid arteries are heavily calcified but patent.  No definite hyperdense intramural hemorrhage appreciated on the initial noncontrast phase of the chest.  Central pulmonary arteries are patent.  No large pulmonary embolus or filling defect.  Extensive mitral valve annular calcifications and native coronary calcifications.  Heart is enlarged.  No pericardial effusion.  Small dependent non loculated pleural effusions bilaterally.  Mild adenopathy in the chest with an AP window node measuring 10 mm short axis.  Precarinal lymph nodes measure 12 mm in short axis. These are likely reactive.  Lung windows demonstrate diffuse patchy ground-glass opacity throughout both lungs with interlobular septal thickening compatible with edema.  Patchy bibasilar atelectasis noted. Trachea and central airways appear patent.  No mucous plugging or bronchiectasis.  No definite focal pneumonia.  Degenerative changes noted of the thoracic spine.  No compression fracture.   Review of the  MIP images confirms the above findings.  IMPRESSION: Extensive heavy calcific atherosclerosis of the thoracic aorta. Negative for dissection or acute vascular process.  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  Negative for significant acute pulmonary embolus  Cardiomegaly with diffuse mild edema and pleural effusions compatible with mild CHF  Bibasilar atelectasis  CTA ABDOMEN AND PELVIS  Findings:  Upper abdominal aorta demonstrates a bypass graft which is patent from the aorta to the celiac and superior mesenteric arteries.  Celiac and SMA branches are patent.  IMA remains patent off the distal aorta.  Extensive heavy calcification of the abdominal aorta with marked luminal narrowing and calcified thrombus involving the suprarenal aorta and infrarenal aorta. Despite his significant aortic narrowing, the aorta remains patent. No definite acute dissection.  Negative for retroperitoneal hemorrhage or rupture.  Aortic bifurcation remains patent.  Common, internal, external iliac arteries in the pelvis are patent.  Common femoral, profunda femoral, proximal superficial femoral arteries into the lower extremities are patent.  No iliac inflow disease, dissection, aneurysm or occlusive process.  Exam of the abdominal vasculature is stable.  Nonvascular findings:  Liver, gallbladder, biliary system, pancreas, mildly thickened adrenal glands, and kidneys are within normal limits for age and demonstrate no acute process.  Renal atrophy of the right kidney lower pole with small cysts.  Negative for bowel obstruction, dilatation, ileus, or free air. Exam of bowel is limited without oral contrast.  No abdominal or pelvic free fluid, fluid collection, hemorrhage, or abscess.  Urinary bladder unremarkable.  Uterus is atrophic.  Stool- filled bowel in the pelvis.  No inguinal abnormality or hernia.  Diffuse degenerative changes and osteopenia of the spine, pelvis and hips.   Review of the MIP images confirms the above  findings.  IMPRESSION: Stable extensive calcific atherosclerosis of the native abdominal aorta with luminal narrowing of the aorta in the suprarenal and infrarenal areas.  No definite aortic occlusive process.  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  IMA also remains patent.  Stable patent iliac pelvic vasculature.  Negative for acute dissection or retroperitoneal hemorrhage.   Original Report Authenticated By: Judie Petit. Miles Costain, M.D.   Ct Angio Abd/pel W/ And/or W/o  02/05/2013   *RADIOLOGY REPORT*  Clinical Data:  Extensive atherosclerotic plaquing in the descending aortic arch by transesophageal echo concerning for ulcerated plaque formation or dissection.  History of mesenteric bypass  CT ANGIOGRAPHY CHEST, ABDOMEN AND PELVIS  Technique:  Multidetector CT imaging through the chest, abdomen and pelvis was performed using the standard protocol during bolus administration of intravenous contrast.  Multiplanar reconstructed images including MIPs were obtained and reviewed to evaluate the vascular anatomy.  Contrast: 80mL OMNIPAQUE IOHEXOL 350 MG/ML SOLN  Comparison:  09/26/2010, 07/20/2010  CTA CHEST  Findings:  Extensive heavy calcification of the major branch vessels and the entire thoracic aorta.  Negative for acute dissection.  Diffuse mural thickening of the descending aorta.  No large ulcerated plaque formation appreciated.  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  Brachiocephalic and left common carotid arteries are heavily calcified but patent.  No definite hyperdense intramural hemorrhage appreciated on the initial noncontrast phase of the chest.  Central pulmonary arteries are patent.  No large pulmonary embolus or filling defect.  Extensive mitral valve annular calcifications and native coronary calcifications.  Heart is enlarged.  No pericardial effusion.  Small dependent non loculated pleural effusions bilaterally.  Mild adenopathy in the chest with an AP window node measuring 10 mm short  axis.  Precarinal lymph nodes measure 12 mm in short axis. These are likely reactive.  Lung windows demonstrate diffuse patchy ground-glass opacity throughout both lungs with interlobular septal thickening compatible with edema.  Patchy bibasilar atelectasis noted. Trachea and central airways appear patent.  No mucous plugging or bronchiectasis.  No definite focal pneumonia.  Degenerative changes noted of the thoracic spine.  No compression fracture.   Review of the MIP images confirms the above findings.  IMPRESSION: Extensive heavy calcific atherosclerosis of the thoracic aorta. Negative for dissection or acute vascular process.  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  Negative for significant acute pulmonary embolus  Cardiomegaly with diffuse mild edema and pleural effusions compatible with mild CHF  Bibasilar atelectasis  CTA ABDOMEN AND PELVIS  Findings:  Upper abdominal aorta demonstrates a bypass graft which is patent from the aorta to the celiac and superior mesenteric arteries.  Celiac and SMA branches are patent.  IMA remains patent off the distal aorta.  Extensive heavy calcification of the abdominal aorta with marked luminal narrowing and calcified thrombus involving the suprarenal aorta and infrarenal aorta. Despite his significant aortic narrowing, the aorta remains patent. No definite acute dissection.  Negative for retroperitoneal hemorrhage or rupture.  Aortic bifurcation remains patent.  Common, internal, external iliac arteries in the pelvis are patent.  Common femoral, profunda femoral, proximal superficial femoral arteries into the lower extremities are patent.  No iliac inflow disease, dissection, aneurysm or occlusive process.  Exam of the abdominal vasculature is stable.  Nonvascular findings:  Liver, gallbladder, biliary system, pancreas, mildly thickened adrenal glands, and kidneys are within normal limits for age and demonstrate no acute process.  Renal atrophy of the right  kidney lower pole with small cysts.  Negative for bowel obstruction, dilatation, ileus, or free air. Exam of bowel is limited without oral contrast.  No abdominal or pelvic free fluid, fluid collection, hemorrhage, or abscess.  Urinary bladder unremarkable.  Uterus is atrophic.  Stool- filled bowel in the pelvis.  No inguinal abnormality or hernia.  Diffuse degenerative changes and osteopenia of the spine, pelvis and hips.   Review of the MIP images confirms the above findings.  IMPRESSION: Stable extensive calcific atherosclerosis of the native abdominal aorta with luminal narrowing of the aorta in the suprarenal and infrarenal areas.  No definite aortic occlusive process.  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  IMA also remains patent.  Stable patent iliac pelvic vasculature.  Negative for acute dissection or retroperitoneal hemorrhage.   Original Report Authenticated By: Judie Petit. Miles Costain, M.D.    Microbiology:  No results found for this or any previous visit (from the past 240 hour(s)).   Labs: Basic Metabolic Panel:  Recent Labs Lab 02/25/13 0715 02/28/13 0435 03/01/13 0500 03/02/13 0446  NA 144 150* 147* 143  K 4.7 4.0 4.6 4.8  CL 107 111 111 108  CO2 29 26 26 27   GLUCOSE 80 104* 141* 102*  BUN 27* 16 17 18   CREATININE 1.65* 1.09 1.15* 1.13*  CALCIUM 9.5 8.7 8.8 8.9   Liver Function Tests: No results found for this basename: AST, ALT, ALKPHOS, BILITOT, PROT, ALBUMIN,  in the last 168 hours No results found for this basename: LIPASE, AMYLASE,  in the last 168 hours No results found for this basename: AMMONIA,  in the last 168 hours CBC:  Recent Labs Lab 02/25/13 0715 02/27/13 0815 02/28/13 0435 03/01/13 0500 03/02/13 0446  WBC 9.1 11.2* 10.5 11.0* 9.8  HGB 10.0* 10.2* 10.3* 10.4* 10.4*  HCT 29.7* 30.8* 31.4* 33.1* 32.7*  MCV 93.1 95.7 96.0 97.4 97.0  PLT 191 237 206 215 221   Cardiac Enzymes: No results found for this basename: CKTOTAL, CKMB, CKMBINDEX, TROPONINI,  in  the last 168 hours BNP: BNP (last 3 results) No results found for this basename: PROBNP,  in the last 8760 hours CBG: No results found for this basename: GLUCAP,  in the last 168 hours     Signed:  Marinda Elk  [facility] 03/03/2013, 2:14 PM

## 2013-02-26 NOTE — Progress Notes (Signed)
   Subjective:  Denies CP or dyspnea   Objective:  Filed Vitals:   02/25/13 1625 02/25/13 1700 02/25/13 2027 02/26/13 0639  BP:  103/65 91/57 101/74  Pulse:  110 105 113  Temp:   98 F (36.7 C) 99 F (37.2 C)  TempSrc:   Oral Oral  Resp: 20 20 19 19   Height:      Weight:    107 lb 14.4 oz (48.943 kg)  SpO2: 94% 93% 93% 90%    Intake/Output from previous day:  Intake/Output Summary (Last 24 hours) at 02/26/13 0726 Last data filed at 02/25/13 2231  Gross per 24 hour  Intake   1312 ml  Output    825 ml  Net    487 ml    Physical Exam: Physical exam: Well-developed frail in no acute distress.  Skin is warm and dry.  HEENT is normal.  Neck is supple.  Chest with diminished BS bases Cardiovascular exam irregular Abdominal exam nontender or distended. No masses palpated. Extremities show no edema. neuro grossly intact    Lab Results: Basic Metabolic Panel:  Recent Labs  16/10/96 1406 02/25/13 0715  NA 139 144  K 3.8 4.7  CL 99 107  CO2 31 29  GLUCOSE 107* 80  BUN 34* 27*  CREATININE 1.79* 1.65*  CALCIUM 10.5 9.5   CBC:  Recent Labs  02/24/13 1406 02/25/13 0715  WBC 8.5 9.1  HGB 7.0* 10.0*  HCT 21.5* 29.7*  MCV 98.6 93.1  PLT 208 191  Telemetry - atrial flutter  Assessment/Plan:  1 atrial fibrillation/flutter-the patient remains in atrial flutter this AM. Her rate is 90 to 110 with occasional higher rates. Would continue amiodarone for rate control. Her BP is borderline; will try low dose lopressor to see if she tolerates. Coumadin and ASA DCed previously because of recent acute anemia from GI blood loss. There is risk of an embolic event given the recurrent atrial arrhythmia and recent DCCV. However given probable GI bleed and anemia we had no other choice. EGD results noted. Bleeding felt most likely related to gastric angioectasia which was injected and clipped; patient declines colonoscopy to R/O lower source. Plan add apixaban 2.5 BID (see GI  note; ok to resume anticoagulation). Watch HGB and HR. DC in AM if stable; proceed with DCCV in 3 weeks if Hgb remains stable and no bleeding. Patient understands risk of bleeding with anticoagulation. She was symptomatic with her atrial fibrillation previously and this certainly could be contributing to her weakness and dyspnea in addition to her acute blood loss anemia.  2 acute blood loss anemia-as outlined above 3 acute renal failure-BUN and creatinine improving. Possibly related to hypoperfusion secondary to anemia/blood loss. Follow renal function.  4 tobacco abuse-patient counseled on discontinuing.   Sydney Quinn 02/26/2013, 7:26 AM

## 2013-02-26 NOTE — Care Management Note (Signed)
    Page 1 of 1   02/26/2013     4:09:01 PM   CARE MANAGEMENT NOTE 02/26/2013  Patient:  Sydney Quinn, Sydney Quinn   Account Number:  [id number]  Date Initiated:  02/26/2013  Documentation initiated by:  Tera Mater  Subjective/Objective Assessment:   79yo female admitted with GI bleed.  Pt. lives at home alone, however has her son to assist when needed.     Action/Plan:   discharge planning   Anticipated DC Date:  02/27/2013   Anticipated DC Plan:  HOME/SELF CARE      DC Planning Services  CM consult  Medication Assistance      Choice offered to / List presented to:             Status of service:  In process, will continue to follow Medicare Important Message given?   (If response is "NO", the following Medicare IM given date fields will be blank) Date Medicare IM given:   Date Additional Medicare IM given:    Discharge Disposition:    Per UR Regulation:  Reviewed for med. necessity/level of care/duration of stay  If discussed at Long Length of Stay Meetings, dates discussed:    Comments:  02/26/13 1600 Noted pt. on Eliquis.  Placed a benefits check to find out about copay and for prior authorization.  NCM will follow. Tera Mater, RN, BSN NCM [phone]

## 2013-02-26 NOTE — Progress Notes (Addendum)
[facility] PROGRESS NOTE  Assessment/Plan: Angiodysplasia of stomach/GI bleed/ Acute blood loss anemia: - s/p EGD 10.01.2014: Angioectasia in the proximal gastric body; Epinephrine  injection was given to control bleeding; APC ablation, and 2 hemostatic clips. - S/p 2 units transfusion. HBG 10, recheck in am. - resume apixiban, risk and benefits d/w patient. - no sign of overt bleeding.   Acute renal insufficiency - most likely pre-renal start IV fluid. - improving with IV fluids..   A-fib - with orthopnea - given difficulty controlling rate and symptoms of orthopnea,cardiology rec TEE guided DCCV, will need to be Monday after she has received 5 doses of apixaban; follow Hgb over weekend to make sure stable on anticoagulation prior to DCCV.   Code Status: full Family Communication: none  Disposition Plan:  inpatinet   Consultants:  GI  Procedures:  EGD 10.1.2014  Antibiotics: none  HPI/Subjective: No melena, no complains.  Objective: Filed Vitals:   02/25/13 1625 02/25/13 1700 02/25/13 2027 02/26/13 0639  BP:  103/65 91/57 101/74  Pulse:  110 105 113  Temp:   98 F (36.7 C) 99 F (37.2 C)  TempSrc:   Oral Oral  Resp: 20 20 19 19   Height:      Weight:    48.943 kg (107 lb 14.4 oz)  SpO2: 94% 93% 93% 90%    Intake/Output Summary (Last 24 hours) at 02/26/13 1032 Last data filed at 02/25/13 2231  Gross per 24 hour  Intake   1312 ml  Output    825 ml  Net    487 ml   Filed Weights   02/24/13 1720 02/25/13 0630 02/26/13 0639  Weight: 48.6 kg (107 lb 2.3 oz) 48.399 kg (106 lb 11.2 oz) 48.943 kg (107 lb 14.4 oz)    Exam:  General: Alert, awake, oriented x3, in no acute distress.  HEENT: No bruits, no goiter.  Heart: Regular rate and rhythm, without murmurs, rubs, gallops.  Lungs: Good air movement, bilateral air movement.  Abdomen: Soft, nontender, nondistended, positive bowel sounds.  Neuro: Grossly intact, nonfocal.   Data Reviewed: Basic  Metabolic Panel:  Recent Labs Lab 02/24/13 1406 02/25/13 0715  NA 139 144  K 3.8 4.7  CL 99 107  CO2 31 29  GLUCOSE 107* 80  BUN 34* 27*  CREATININE 1.79* 1.65*  CALCIUM 10.5 9.5   Liver Function Tests: No results found for this basename: AST, ALT, ALKPHOS, BILITOT, PROT, ALBUMIN,  in the last 168 hours No results found for this basename: LIPASE, AMYLASE,  in the last 168 hours No results found for this basename: AMMONIA,  in the last 168 hours CBC:  Recent Labs Lab 02/24/13 1406 02/25/13 0715  WBC 8.5 9.1  HGB 7.0* 10.0*  HCT 21.5* 29.7*  MCV 98.6 93.1  PLT 208 191   Cardiac Enzymes: No results found for this basename: CKTOTAL, CKMB, CKMBINDEX, TROPONINI,  in the last 168 hours BNP (last 3 results) No results found for this basename: PROBNP,  in the last 8760 hours CBG: No results found for this basename: GLUCAP,  in the last 168 hours  No results found for this or any previous visit (from the past 240 hour(s)).   Studies: No results found.  Scheduled Meds: . amiodarone  400 mg Oral Daily  . apixaban  2.5 mg Oral BID  . gabapentin  500 mg Oral QHS  . influenza vac split quadrivalent PF  0.5 mL Intramuscular Tomorrow-1000  . metoprolol tartrate  12.5 mg Oral BID  .  pantoprazole  40 mg Oral Q0600  . sodium chloride  3 mL Intravenous Q12H   Continuous Infusions:     Marinda Elk  [facility] Pager [phone]. If 8PM-8AM, please contact night-coverage at [email or web address], password [facility] 02/26/2013, 10:32 AM  LOS: 2 days

## 2013-02-27 ENCOUNTER — Telehealth: Payer: Self-pay | Admitting: Cardiology

## 2013-02-27 LAB — CBC
HCT: 30.8 % — ABNORMAL LOW (ref 36.0–46.0)
Hemoglobin: 10.2 g/dL — ABNORMAL LOW (ref 12.0–15.0)
MCH: 31.7 pg (ref 26.0–34.0)
MCHC: 33.1 g/dL (ref 30.0–36.0)
MCV: 95.7 fL (ref 78.0–100.0)
Platelets: 237 10*3/uL (ref 150–400)
RBC: 3.22 MIL/uL — ABNORMAL LOW (ref 3.87–5.11)

## 2013-02-27 NOTE — Telephone Encounter (Signed)
New Problem   Pt's son requests to speak with the Dr. Or a PA and not a nurse about the status of his mother in the hospital. Please call as soon as possible.

## 2013-02-27 NOTE — Progress Notes (Signed)
Physical Therapy Treatment Patient Details Name: Sydney Quinn MRN: [id number] DOB: 18-Dec-1929 Today's Date: 02/27/2013 Time: 8657-8469 PT Time Calculation (min): 19 min  PT Assessment / Plan / Recommendation  History of Present Illness 79 year old female with history of hypertension, CAD, atrial fibrillation s/p cardioversion on 02/04/2013, was discharged on 02/06/13 from [facility] on amiodarone and Coumadin. Patient presented back to ER with increasing generalized weakness, fatigue and "low energy". Patient lives at home by herself. Patient was accompanied by her son. Patient reports that 3 days after she was discharged, she had dark black melanotic stool but she did not pay much attention at that time. She usually has constipation. However in the last 2-3 days, she's been having poor appetite, increasing generalized weakness, fatigue and dyspnea on exertion.   PT Comments   Pt moving well however continues to fatigue quickly.  Pt with decrease in SaO2 to 85% during ambulation on RA.  Pt Sa02 slightly improved to 89-92% on 2L with ambulation .    Follow Up Recommendations  No PT follow up     Equipment Recommendations  None recommended by PT    Frequency Min 2X/week   Progress towards PT Goals Progress towards PT goals: Progressing toward goals  Plan Current plan remains appropriate    Precautions / Restrictions Precautions Precautions: Fall Restrictions Weight Bearing Restrictions: No   Pertinent Vitals/Pain No c/o pain; continues to have increase HR in 120s with ambulation    Mobility  Bed Mobility Bed Mobility: Supine to Sit Supine to Sit: 6: Modified independent (Device/Increase time);HOB elevated Transfers Transfers: Sit to Stand;Stand to Sit Sit to Stand: 5: Supervision;From chair/3-in-1;From toilet Stand to Sit: 5: Supervision;To chair/3-in-1 Details for Transfer Assistance: Supervision for safety Ambulation/Gait Ambulation/Gait Assistance: 4: Min guard Ambulation  Distance (Feet): 200 Feet (100' x 2) Assistive device: None Ambulation/Gait Assistance Details: No LOB notified however decrease in Sa02 during ambulation on RA and with 2L O2 Gait Pattern: Step-through pattern;Decreased stride length Gait velocity: cautious/guarded    Exercises     PT Diagnosis:    PT Problem List:   PT Treatment Interventions:     PT Goals (current goals can now be found in the care plan section) Acute Rehab PT Goals Patient Stated Goal: go home PT Goal Formulation: With patient Potential to Achieve Goals: Good  Visit Information  Last PT Received On: 02/27/13 Assistance Needed: +1 History of Present Illness: 79 year old female with history of hypertension, CAD, atrial fibrillation s/p cardioversion on 02/04/2013, was discharged on 02/06/13 from [facility] on amiodarone and Coumadin. Patient presented back to ER with increasing generalized weakness, fatigue and "low energy". Patient lives at home by herself. Patient was accompanied by her son. Patient reports that 3 days after she was discharged, she had dark black melanotic stool but she did not pay much attention at that time. She usually has constipation. However in the last 2-3 days, she's been having poor appetite, increasing generalized weakness, fatigue and dyspnea on exertion.    Subjective Data  Subjective: "I want to get up.  I'm use to working out 4 times per day." Patient Stated Goal: go home   Cognition  Cognition Arousal/Alertness: Awake/alert Behavior During Therapy: WFL for tasks assessed/performed Overall Cognitive Status: Within Functional Limits for tasks assessed    Balance     End of Session PT - End of Session Equipment Utilized During Treatment: Gait belt Activity Tolerance: Patient tolerated treatment well Patient left: in chair;with call bell/phone within reach;with family/visitor present Nurse Communication: Mobility status  GP     [name] 02/27/2013, 1:33 PM  Jake Shark,  PT DPT [phone]

## 2013-02-27 NOTE — Progress Notes (Signed)
02/27/13 1439 Benefits check for Eliquis returned with pt. would have to pay 47.5% and needs prior authorization.  The authorization number is [id number].  Physician, please call for prior auth.   Tera Mater, RN, BSN NCM [phone]

## 2013-02-27 NOTE — Progress Notes (Signed)
   Subjective:  Denies CP; complains of orthopnea   Objective:  Filed Vitals:   02/26/13 1331 02/26/13 1640 02/26/13 2038 02/26/13 2106  BP: 83/62 115/64 84/56 105/68  Pulse: 92 94 120 104  Temp: 99 F (37.2 C)  98.2 F (36.8 C)   TempSrc: Oral  Oral   Resp: 20 18 19    Height:      Weight:      SpO2: 92% 96% 92%     Intake/Output from previous day:  Intake/Output Summary (Last 24 hours) at 02/27/13 0719 Last data filed at 02/27/13 0631  Gross per 24 hour  Intake    603 ml  Output      0 ml  Net    603 ml    Physical Exam: Physical exam: Well-developed frail in no acute distress.  Skin is warm and dry.  HEENT is normal.  Neck is supple.  Chest with diminished BS bases Cardiovascular exam irregular Abdominal exam nontender or distended. No masses palpated. Extremities show no edema. neuro grossly intact    Lab Results: Basic Metabolic Panel:  Recent Labs  16/10/96 1406 02/25/13 0715  NA 139 144  K 3.8 4.7  CL 99 107  CO2 31 29  GLUCOSE 107* 80  BUN 34* 27*  CREATININE 1.79* 1.65*  CALCIUM 10.5 9.5   CBC:  Recent Labs  02/24/13 1406 02/25/13 0715  WBC 8.5 9.1  HGB 7.0* 10.0*  HCT 21.5* 29.7*  MCV 98.6 93.1  PLT 208 191  Telemetry - atrial flutter  Assessment/Plan:  1 atrial fibrillation/flutter-the patient remains in atrial flutter this AM. Heart rate is elevated. Will continue amiodarone and lopressor for rate control. Her BP is borderline and limits additional rate controlling meds. Coumadin and ASA DCed previously because of recent acute anemia from GI blood loss. There is risk of an embolic event given the recurrent atrial arrhythmia and recent DCCV. However given probable GI bleed and anemia we had no other choice. EGD results noted. Bleeding felt most likely related to gastric angioectasia which was injected and clipped; patient declined colonoscopy to R/O lower source. Apixaban added (see GI note; ok to resume anticoagulation). Given  difficulty controlling rate and symptoms of orthopnea, would favor TEE guided DCCV (will need to be Monday after she has received 5 doses of apixaban; follow Hgb over weekend to make sure stable on anticoagulation prior to DCCV). Hopefully she will hold sinus now that amiodarone fully loaded. 2 acute blood loss anemia-as outlined above 3 acute renal failure-BUN and creatinine improving. Possibly related to hypoperfusion secondary to anemia/blood loss. Follow renal function. Avoid further hydration. 4 tobacco abuse-patient counseled on discontinuing.   Sydney Quinn 02/27/2013, 7:19 AM

## 2013-02-27 NOTE — Telephone Encounter (Signed)
Will forward for dr crenshaw review  

## 2013-02-27 NOTE — Progress Notes (Signed)
Patient complaining of shortness of breath while laying flat.  Patients oxygen saturation 92% on room air and is requesting oxygen while sleeping.  2L oxygen via nasal cannula applied.  Will continue to monitor patient.

## 2013-02-28 DIAGNOSIS — I5031 Acute diastolic (congestive) heart failure: Secondary | ICD-10-CM

## 2013-02-28 LAB — CBC
HCT: 31.4 % — ABNORMAL LOW (ref 36.0–46.0)
MCHC: 32.8 g/dL (ref 30.0–36.0)
MCV: 96 fL (ref 78.0–100.0)
Platelets: 206 10*3/uL (ref 150–400)
RDW: 17.9 % — ABNORMAL HIGH (ref 11.5–15.5)

## 2013-02-28 LAB — BASIC METABOLIC PANEL
BUN: 16 mg/dL (ref 6–23)
Creatinine, Ser: 1.09 mg/dL (ref 0.50–1.10)
GFR calc Af Amer: 54 mL/min — ABNORMAL LOW (ref >90)
GFR calc non Af Amer: 46 mL/min — ABNORMAL LOW (ref >90)
Glucose, Bld: 104 mg/dL — ABNORMAL HIGH (ref 70–99)

## 2013-02-28 MED ORDER — SODIUM CHLORIDE 0.9 % IV BOLUS (SEPSIS)
500.0000 mL | Freq: Once | INTRAVENOUS | Status: AC
  Administered 2013-02-28: 13:00:00 500 mL via INTRAVENOUS

## 2013-02-28 MED ORDER — METOPROLOL TARTRATE 25 MG PO TABS
25.0000 mg | ORAL_TABLET | Freq: Two times a day (BID) | ORAL | Status: DC
  Administered 2013-02-28 – 2013-03-01 (×2): 25 mg via ORAL
  Filled 2013-02-28 (×4): qty 1

## 2013-02-28 NOTE — Progress Notes (Signed)
Pt resting on bed, VSS, HR on 110's Afib, denies any pain, SOB or discomfort at this time, no distress noticed. Family member at the bedside.

## 2013-02-28 NOTE — Progress Notes (Signed)
   Subjective:  Denies CP; complains of orthopnea   Objective:  Filed Vitals:   02/27/13 1034 02/27/13 1403 02/27/13 2032 02/28/13 0450  BP: 118/75 104/75 110/83 116/82  Pulse: 115 110 112 188  Temp:  98.9 F (37.2 C) 98.9 F (37.2 C) 97.5 F (36.4 C)  TempSrc:  Oral Oral Oral  Resp: 16 20 20    Height:      Weight:      SpO2: 96% 96% 96% 82%    Intake/Output from previous day:  Intake/Output Summary (Last 24 hours) at 02/28/13 0716 Last data filed at 02/28/13 0537  Gross per 24 hour  Intake    718 ml  Output    400 ml  Net    318 ml    Physical Exam: Physical exam: Well-developed frail in no acute distress.  Skin is warm and dry.  HEENT is normal.  Neck is supple.  Chest with diminished BS bases Cardiovascular exam irregular Abdominal exam nontender or distended. No masses palpated. Extremities show no edema. neuro grossly intact    Lab Results: CBC:  Recent Labs  02/27/13 0815 02/28/13 0435  WBC 11.2* 10.5  HGB 10.2* 10.3*  HCT 30.8* 31.4*  MCV 95.7 96.0  PLT 237 206  Telemetry - atrial flutter  Assessment/Plan:  1 atrial fibrillation/flutter-the patient remains in atrial flutter this AM. Heart rate is elevated. Will continue amiodarone and increase lopressor to 25 BID for rate control. Her BP is borderline; watch closely. Coumadin and ASA DCed previously because of recent acute anemia from GI blood loss. There is risk of an embolic event given the recurrent atrial arrhythmia and recent DCCV. However given probable GI bleed and anemia we had no other choice. EGD results noted. Bleeding felt most likely related to gastric angioectasia which was injected and clipped; patient declined colonoscopy to R/O lower source. Apixaban added (see GI note; ok to resume anticoagulation). Given difficulty controlling rate and symptoms of orthopnea, would favor TEE guided DCCV (scheduled for Monday; follow Hgb over weekend to make sure stable on anticoagulation prior to  DCCV). Hopefully she will hold sinus now that amiodarone fully loaded. 2 acute blood loss anemia-as outlined above; HGB stable 3 acute renal failure-BUN and creatinine improving. Possibly related to hypoperfusion secondary to anemia/blood loss. Follow renal function. Avoid further hydration. 4 tobacco abuse-patient counseled on discontinuing.   Olga Millers 02/28/2013, 7:16 AM

## 2013-02-28 NOTE — Progress Notes (Signed)
[facility] PROGRESS NOTE  Assessment/Plan: Angiodysplasia of stomach/GI bleed/ Acute blood loss anemia: - s/p EGD 10.01.201 - S/p 2 units transfusion. HBG 10. - resume apixiban, risk and benefits d/w patient. - no sign of overt bleeding. - TEE with cardioversion on 10.6.2014.   Acute renal insufficiency - most likely pre-renal start IV fluid. - improving with IV fluids..   A-fib - with orthopnea - given difficulty controlling rate and symptoms of orthopnea,cardiology rec TEE guided DCCV, will need to be Monday after she has received 5 doses of apixaban; follow Hgb over weekend to make sure stable on anticoagulation prior to DCCV.   Code Status: full Family Communication: none  Disposition Plan:  inpatinet   Consultants:  GI  Procedures:  EGD 10.1.2014  Antibiotics: none  HPI/Subjective: No melena, still SOB  Objective: Filed Vitals:   02/27/13 1403 02/27/13 2032 02/28/13 0450 02/28/13 1004  BP: 104/75 110/83 116/82 88/64  Pulse: 110 112 188 118  Temp: 98.9 F (37.2 C) 98.9 F (37.2 C) 97.5 F (36.4 C)   TempSrc: Oral Oral Oral   Resp: 20 20    Height:      Weight:      SpO2: 96% 96% 82%     Intake/Output Summary (Last 24 hours) at 02/28/13 1024 Last data filed at 02/28/13 0842  Gross per 24 hour  Intake    698 ml  Output      0 ml  Net    698 ml   Filed Weights   02/24/13 1720 02/25/13 0630 02/26/13 0639  Weight: 48.6 kg (107 lb 2.3 oz) 48.399 kg (106 lb 11.2 oz) 48.943 kg (107 lb 14.4 oz)    Exam:  General: Alert, awake, oriented x3, in no acute distress.  HEENT: No bruits, no goiter.  Heart: Regular rate and rhythm, without murmurs, rubs, gallops.  Lungs: Good air movement, bilateral air movement.  Abdomen: Soft, nontender, nondistended, positive bowel sounds.  Neuro: Grossly intact, nonfocal.   Data Reviewed: Basic Metabolic Panel:  Recent Labs Lab 02/24/13 1406 02/25/13 0715 02/28/13 0435  NA 139 144 150*  K 3.8 4.7 4.0   CL 99 107 111  CO2 31 29 26   GLUCOSE 107* 80 104*  BUN 34* 27* 16  CREATININE 1.79* 1.65* 1.09  CALCIUM 10.5 9.5 8.7   Liver Function Tests: No results found for this basename: AST, ALT, ALKPHOS, BILITOT, PROT, ALBUMIN,  in the last 168 hours No results found for this basename: LIPASE, AMYLASE,  in the last 168 hours No results found for this basename: AMMONIA,  in the last 168 hours CBC:  Recent Labs Lab 02/24/13 1406 02/25/13 0715 02/27/13 0815 02/28/13 0435  WBC 8.5 9.1 11.2* 10.5  HGB 7.0* 10.0* 10.2* 10.3*  HCT 21.5* 29.7* 30.8* 31.4*  MCV 98.6 93.1 95.7 96.0  PLT 208 191 237 206   Cardiac Enzymes: No results found for this basename: CKTOTAL, CKMB, CKMBINDEX, TROPONINI,  in the last 168 hours BNP (last 3 results) No results found for this basename: PROBNP,  in the last 8760 hours CBG: No results found for this basename: GLUCAP,  in the last 168 hours  No results found for this or any previous visit (from the past 240 hour(s)).   Studies: No results found.  Scheduled Meds: . amiodarone  400 mg Oral Daily  . apixaban  2.5 mg Oral BID  . gabapentin  500 mg Oral QHS  . metoprolol tartrate  25 mg Oral BID  . pantoprazole  40 mg Oral Q0600  . sodium chloride  3 mL Intravenous Q12H   Continuous Infusions:     Marinda Elk  [facility] Pager [phone]. If 8PM-8AM, please contact night-coverage at [email or web address], password [facility] 02/28/2013, 10:24 AM  LOS: 4 days

## 2013-03-01 LAB — BASIC METABOLIC PANEL
BUN: 17 mg/dL (ref 6–23)
CO2: 26 mEq/L (ref 19–32)
Calcium: 8.8 mg/dL (ref 8.4–10.5)
Creatinine, Ser: 1.15 mg/dL — ABNORMAL HIGH (ref 0.50–1.10)
GFR calc Af Amer: 50 mL/min — ABNORMAL LOW (ref >90)
Potassium: 4.6 mEq/L (ref 3.5–5.1)

## 2013-03-01 LAB — CBC
Hemoglobin: 10.4 g/dL — ABNORMAL LOW (ref 12.0–15.0)
MCH: 30.6 pg (ref 26.0–34.0)
MCHC: 31.4 g/dL (ref 30.0–36.0)
MCV: 97.4 fL (ref 78.0–100.0)
Platelets: 215 10*3/uL (ref 150–400)
RDW: 17.8 % — ABNORMAL HIGH (ref 11.5–15.5)
WBC: 11 10*3/uL — ABNORMAL HIGH (ref 4.0–10.5)

## 2013-03-01 MED ORDER — METOPROLOL TARTRATE 25 MG PO TABS
25.0000 mg | ORAL_TABLET | Freq: Two times a day (BID) | ORAL | Status: DC
  Administered 2013-03-02: 22:00:00 25 mg via ORAL
  Filled 2013-03-01 (×3): qty 1

## 2013-03-01 MED ORDER — SODIUM CHLORIDE 0.9 % IV SOLN
INTRAVENOUS | Status: DC
  Administered 2013-03-02: 12:00:00 via INTRAVENOUS

## 2013-03-01 MED ORDER — METOPROLOL TARTRATE 25 MG PO TABS
25.0000 mg | ORAL_TABLET | Freq: Two times a day (BID) | ORAL | Status: AC
  Administered 2013-03-01: 25 mg via ORAL
  Filled 2013-03-01: qty 1

## 2013-03-01 NOTE — Progress Notes (Signed)
   Subjective:  Denies CP; complains of orthopnea   Objective:  Filed Vitals:   02/28/13 1134 02/28/13 1512 02/28/13 2126 03/01/13 0613  BP: 86/56 130/74 100/73 118/83  Pulse: 120 113 125 118  Temp:  98.5 F (36.9 C) 97.9 F (36.6 C) 97.9 F (36.6 C)  TempSrc:  Oral Oral Oral  Resp:  20 18 18   Height:      Weight:    108 lb (48.988 kg)  SpO2:  93% 93% 95%    Intake/Output from previous day:  Intake/Output Summary (Last 24 hours) at 03/01/13 1001 Last data filed at 03/01/13 0931  Gross per 24 hour  Intake   1680 ml  Output    150 ml  Net   1530 ml    Physical Exam: Physical exam: Well-developed frail in no acute distress.  Skin is warm and dry.  HEENT is normal.  Neck is supple.  Chest with diminished BS bases Cardiovascular exam irregular Abdominal exam nontender or distended. No masses palpated. Extremities show no edema. neuro grossly intact    Lab Results: CBC:  Recent Labs  02/28/13 0435 03/01/13 0500  WBC 10.5 11.0*  HGB 10.3* 10.4*  HCT 31.4* 33.1*  MCV 96.0 97.4  PLT 206 215  Telemetry - atrial flutter  Assessment/Plan:  1 atrial fibrillation/flutter-the patient remains in atrial flutter this AM. Heart rate is elevated. Will continue amiodarone and continue lopressor 25 BID for rate control. Her BP is borderline; watch closely. Coumadin and ASA DCed previously because of recent acute anemia from GI blood loss. EGD results noted. Bleeding felt most likely related to gastric angioectasia which was injected and clipped; patient declined colonoscopy to R/O lower source. Apixaban added (see GI note; ok to resume anticoagulation); Hgb stable. Given difficulty controlling rate and symptoms of orthopnea, would favor TEE guided DCCV (scheduled for Monday). Hopefully she will hold sinus now that amiodarone fully loaded. Will give one dose of lasix this AM. 2 acute blood loss anemia-as outlined above; HGB stable 3 acute renal failure-BUN and creatinine  improving. Possibly related to hypoperfusion secondary to anemia/blood loss. Follow renal function. Avoid further hydration. 4 tobacco abuse-patient counseled on discontinuing.   Sydney Quinn 03/01/2013, 10:01 AM

## 2013-03-01 NOTE — Progress Notes (Signed)
Up in chair with oxygen.  MD into evaluate client.

## 2013-03-01 NOTE — Progress Notes (Signed)
Patient resting intermittently. No complaints to pain or shortness of breath.

## 2013-03-01 NOTE — Progress Notes (Signed)
[facility] PROGRESS NOTE  Assessment/Plan:  A-fib - with orthopnea, On amiodarone. - TEE with DCCV 10.6.2014.  Angiodysplasia of stomach/GI bleed/ Acute blood loss anemia: - s/p EGD 10.01.201 - S/p 2 units transfusion. HBG 10. - resume apixiban, risk and benefits d/w patient. - no sign of overt bleeding. - TEE with cardioversion on 10.6.2014.  - appreciate cardiology assistance.   Acute renal insufficiency - at baseline.     Code Status: full Family Communication: none  Disposition Plan:  inpatinet   Consultants:  GI  Procedures:  EGD 10.1.2014  TEE & DCCV 10.6.2014  Antibiotics: none  HPI/Subjective: No melena, still SOB  Objective: Filed Vitals:   02/28/13 1512 02/28/13 2126 03/01/13 0613 03/01/13 1003  BP: 130/74 100/73 118/83 110/76  Pulse: 113 125 118 117  Temp: 98.5 F (36.9 C) 97.9 F (36.6 C) 97.9 F (36.6 C)   TempSrc: Oral Oral Oral   Resp: 20 18 18    Height:      Weight:   48.988 kg (108 lb)   SpO2: 93% 93% 95%     Intake/Output Summary (Last 24 hours) at 03/01/13 1025 Last data filed at 03/01/13 0931  Gross per 24 hour  Intake   1680 ml  Output    150 ml  Net   1530 ml   Filed Weights   02/25/13 0630 02/26/13 0639 03/01/13 0613  Weight: 48.399 kg (106 lb 11.2 oz) 48.943 kg (107 lb 14.4 oz) 48.988 kg (108 lb)    Exam:  General: Alert, awake, oriented x3, in no acute distress.  HEENT: No bruits, no goiter.  Heart: Regular rate and rhythm, without murmurs, rubs, gallops.  Lungs: Good air movement, bilateral air movement.  Abdomen: Soft, nontender, nondistended, positive bowel sounds.  Neuro: Grossly intact, nonfocal.   Data Reviewed: Basic Metabolic Panel:  Recent Labs Lab 02/24/13 1406 02/25/13 0715 02/28/13 0435 03/01/13 0500  NA 139 144 150* 147*  K 3.8 4.7 4.0 4.6  CL 99 107 111 111  CO2 31 29 26 26   GLUCOSE 107* 80 104* 141*  BUN 34* 27* 16 17  CREATININE 1.79* 1.65* 1.09 1.15*  CALCIUM 10.5 9.5 8.7  8.8   Liver Function Tests: No results found for this basename: AST, ALT, ALKPHOS, BILITOT, PROT, ALBUMIN,  in the last 168 hours No results found for this basename: LIPASE, AMYLASE,  in the last 168 hours No results found for this basename: AMMONIA,  in the last 168 hours CBC:  Recent Labs Lab 02/24/13 1406 02/25/13 0715 02/27/13 0815 02/28/13 0435 03/01/13 0500  WBC 8.5 9.1 11.2* 10.5 11.0*  HGB 7.0* 10.0* 10.2* 10.3* 10.4*  HCT 21.5* 29.7* 30.8* 31.4* 33.1*  MCV 98.6 93.1 95.7 96.0 97.4  PLT 208 191 237 206 215   Cardiac Enzymes: No results found for this basename: CKTOTAL, CKMB, CKMBINDEX, TROPONINI,  in the last 168 hours BNP (last 3 results) No results found for this basename: PROBNP,  in the last 8760 hours CBG: No results found for this basename: GLUCAP,  in the last 168 hours  No results found for this or any previous visit (from the past 240 hour(s)).   Studies: No results found.  Scheduled Meds: . amiodarone  400 mg Oral Daily  . apixaban  2.5 mg Oral BID  . gabapentin  500 mg Oral QHS  . metoprolol tartrate  25 mg Oral BID  . [START ON 03/02/2013] metoprolol tartrate  25 mg Oral BID  . pantoprazole  40 mg Oral  [id number]  . sodium chloride  3 mL Intravenous Q12H   Continuous Infusions:     Marinda Elk  [facility] Pager [phone]. If 8PM-8AM, please contact night-coverage at [email or web address], password [facility] 03/01/2013, 10:25 AM  LOS: 5 days

## 2013-03-02 ENCOUNTER — Encounter (HOSPITAL_COMMUNITY): Payer: Self-pay | Admitting: *Deleted

## 2013-03-02 ENCOUNTER — Inpatient Hospital Stay (HOSPITAL_COMMUNITY): Payer: Medicare Other | Admitting: Certified Registered Nurse Anesthetist

## 2013-03-02 ENCOUNTER — Encounter: Payer: Self-pay | Admitting: Internal Medicine

## 2013-03-02 ENCOUNTER — Encounter (HOSPITAL_COMMUNITY): Payer: Self-pay | Admitting: Certified Registered Nurse Anesthetist

## 2013-03-02 ENCOUNTER — Encounter (HOSPITAL_COMMUNITY)
Admission: EM | Disposition: A | Discharge status: Home / Self Care | Payer: Self-pay | Source: Home / Self Care | Attending: Internal Medicine

## 2013-03-02 DIAGNOSIS — I4891 Unspecified atrial fibrillation: Secondary | ICD-10-CM

## 2013-03-02 HISTORY — PX: CARDIOVERSION: SHX1299

## 2013-03-02 HISTORY — PX: TEE WITHOUT CARDIOVERSION: SHX5443

## 2013-03-02 LAB — BASIC METABOLIC PANEL
BUN: 18 mg/dL (ref 6–23)
CO2: 27 mEq/L (ref 19–32)
Calcium: 8.9 mg/dL (ref 8.4–10.5)
Creatinine, Ser: 1.13 mg/dL — ABNORMAL HIGH (ref 0.50–1.10)
GFR calc non Af Amer: 44 mL/min — ABNORMAL LOW (ref >90)
Glucose, Bld: 102 mg/dL — ABNORMAL HIGH (ref 70–99)
Sodium: 143 mEq/L (ref 135–145)

## 2013-03-02 LAB — CBC
HCT: 32.7 % — ABNORMAL LOW (ref 36.0–46.0)
MCH: 30.9 pg (ref 26.0–34.0)
MCHC: 31.8 g/dL (ref 30.0–36.0)
Platelets: 221 10*3/uL (ref 150–400)
RDW: 17 % — ABNORMAL HIGH (ref 11.5–15.5)

## 2013-03-02 SURGERY — ECHOCARDIOGRAM, TRANSESOPHAGEAL
Anesthesia: General

## 2013-03-02 MED ORDER — LIDOCAINE HCL (CARDIAC) 20 MG/ML IV SOLN
INTRAVENOUS | Status: DC | PRN
  Administered 2013-03-02: 100 mg via INTRAVENOUS

## 2013-03-02 MED ORDER — MIDAZOLAM HCL 5 MG/ML IJ SOLN
INTRAMUSCULAR | Status: AC
  Filled 2013-03-02: qty 2

## 2013-03-02 MED ORDER — MIDAZOLAM HCL 10 MG/2ML IJ SOLN
INTRAMUSCULAR | Status: DC | PRN
  Administered 2013-03-02: 12:00:00 2 mg via INTRAVENOUS

## 2013-03-02 MED ORDER — PROPOFOL 10 MG/ML IV BOLUS
INTRAVENOUS | Status: DC | PRN
  Administered 2013-03-02: 60 mg via INTRAVENOUS

## 2013-03-02 MED ORDER — SODIUM CHLORIDE 0.9 % IV SOLN
INTRAVENOUS | Status: DC | PRN
  Administered 2013-03-02: 12:00:00 via INTRAVENOUS

## 2013-03-02 MED ORDER — PHENYLEPHRINE HCL 10 MG/ML IJ SOLN
INTRAMUSCULAR | Status: DC | PRN
  Administered 2013-03-02 (×3): 80 ug via INTRAVENOUS

## 2013-03-02 MED ORDER — FENTANYL CITRATE 0.05 MG/ML IJ SOLN
INTRAMUSCULAR | Status: AC
  Filled 2013-03-02: qty 2

## 2013-03-02 MED ORDER — BUTAMBEN-TETRACAINE-BENZOCAINE 2-2-14 % EX AERO
INHALATION_SPRAY | CUTANEOUS | Status: DC | PRN
  Administered 2013-03-02: 2 via TOPICAL

## 2013-03-02 NOTE — CV Procedure (Signed)
See full TEE report in [employer]; no LAA thrombus noted; patent subsequently sedated by anesthesia with lidocaine 100 mg and diprovan  60 mg IV; synchronized DCCV with 120 joules resulted sinus rhythm; no immediate complications; continue apixaban. Sydney Quinn

## 2013-03-02 NOTE — Anesthesia Postprocedure Evaluation (Signed)
  Anesthesia Post-op Note  Patient: Sydney Quinn  Procedure(s) Performed: Procedure(s): TRANSESOPHAGEAL ECHOCARDIOGRAM (TEE) (N/A) CARDIOVERSION (N/A)  Patient Location: PACU and Endoscopy Unit  Anesthesia Type:General  Level of Consciousness: awake and patient cooperative  Airway and Oxygen Therapy: Patient Spontanous Breathing and Patient connected to nasal cannula oxygen  Post-op Pain: none  Post-op Assessment: Post-op Vital signs reviewed, Patient's Cardiovascular Status Stable, Respiratory Function Stable, Patent Airway, No signs of Nausea or vomiting and Pain level controlled  Post-op Vital Signs: Reviewed and stable  Complications: No apparent anesthesia complications

## 2013-03-02 NOTE — Progress Notes (Signed)
PT Cancellation Note  Patient Details Name: Sydney Quinn MRN: [id number] DOB: 1929-10-17   Cancelled Treatment:    Reason Eval/Treat Not Completed: Medical issues which prohibited therapy.  In am pt being cardioverted/TEE.  In pm, pt declined treatment due to fatigue.  Thanks.   INGOLD,[name] 03/02/2013, 3:29 PM  Tawnia Schirm [facility] [phone] (pager)

## 2013-03-02 NOTE — Progress Notes (Signed)
Echocardiogram Echocardiogram Transesophageal has been performed.  Sydney Quinn 03/02/2013, 12:47 PM

## 2013-03-02 NOTE — Transfer of Care (Signed)
Immediate Anesthesia Transfer of Care Note  Patient: Sydney Quinn  Procedure(s) Performed: Procedure(s): TRANSESOPHAGEAL ECHOCARDIOGRAM (TEE) (N/A) CARDIOVERSION (N/A)  Patient Location: PACU  Anesthesia Type:General  Level of Consciousness: awake and patient cooperative  Airway & Oxygen Therapy: Patient Spontanous Breathing and Patient connected to nasal cannula oxygen  Post-op Assessment: Report given to PACU RN, Post -op Vital signs reviewed and stable and Patient moving all extremities X 4  Post vital signs: Reviewed and stable  Complications: No apparent anesthesia complications

## 2013-03-02 NOTE — Interval H&P Note (Signed)
History and Physical Interval Note:  03/02/2013 12:03 PM  Sydney Quinn  has presented today for surgery, with the diagnosis of afib  The various methods of treatment have been discussed with the patient and family. After consideration of risks, benefits and other options for treatment, the patient has consented to  Procedure(s): TRANSESOPHAGEAL ECHOCARDIOGRAM (TEE) (N/A) CARDIOVERSION (N/A) as a surgical intervention .  The patient's history has been reviewed, patient examined, no change in status, stable for surgery.  I have reviewed the patient's chart and labs.  Questions were answered to the patient's satisfaction.     Olga Millers

## 2013-03-02 NOTE — Anesthesia Preprocedure Evaluation (Addendum)
Anesthesia Evaluation  Patient identified by MRN, date of birth, ID band Patient awake    Reviewed: Allergy & Precautions, H&P , NPO status , Patient's Chart, lab work & pertinent test results  Airway Mallampati: II TM Distance: >3 FB Neck ROM: Full    Dental  (+) Teeth Intact and Dental Advisory Given   Pulmonary    Pulmonary exam normal       Cardiovascular hypertension, + CAD, + Past MI and + Peripheral Vascular Disease     Neuro/Psych    GI/Hepatic   Endo/Other  diabetes, Well Controlled, Type 2, Insulin DependentHypothyroidism   Renal/GU Renal InsufficiencyRenal disease     Musculoskeletal   Abdominal Normal abdominal exam  (+)   Peds  Hematology   Anesthesia Other Findings   Reproductive/Obstetrics                           Anesthesia Physical Anesthesia Plan  ASA: II  Anesthesia Plan: General   Post-op Pain Management:    Induction: Intravenous  Airway Management Planned: Mask  Additional Equipment:   Intra-op Plan:   Post-operative Plan:   Informed Consent: I have reviewed the patients History and Physical, chart, labs and discussed the procedure including the risks, benefits and alternatives for the proposed anesthesia with the patient or authorized representative who has indicated his/her understanding and acceptance.     Plan Discussed with: CRNA and Surgeon  Anesthesia Plan Comments:        Anesthesia Quick Evaluation

## 2013-03-02 NOTE — Progress Notes (Signed)
   Subjective:  Denies CP or dyspnea   Objective:  Filed Vitals:   03/01/13 1003 03/01/13 1440 03/01/13 2100 03/02/13 0504  BP: 110/76 110/82 103/63 121/77  Pulse: 117 99 118 106  Temp:  98.5 F (36.9 C) 97.4 F (36.3 C) 97.8 F (36.6 C)  TempSrc:  Oral Oral Oral  Resp:  19 16 18   Height:      Weight:    109 lb 9.6 oz (49.714 kg)  SpO2:  97% 98% 97%    Intake/Output from previous day:  Intake/Output Summary (Last 24 hours) at 03/02/13 0744 Last data filed at 03/01/13 1806  Gross per 24 hour  Intake    920 ml  Output      0 ml  Net    920 ml    Physical Exam: Physical exam: Well-developed frail in no acute distress.  Skin is warm and dry.  HEENT is normal.  Neck is supple.  Chest with diminished BS bases Cardiovascular exam irregular Abdominal exam nontender or distended. No masses palpated. Extremities show no edema. neuro grossly intact    Lab Results: CBC:  Recent Labs  03/01/13 0500 03/02/13 0446  WBC 11.0* 9.8  HGB 10.4* 10.4*  HCT 33.1* 32.7*  MCV 97.4 97.0  PLT 215 221  Telemetry - atrial flutter  Assessment/Plan:  1 atrial fibrillation/flutter-the patient remains in atrial flutter this AM. Heart rate is elevated. Will continue amiodarone; lopressor on hold for TEE/DCCV. Coumadin and ASA DCed previously because of recent acute anemia from GI blood loss. EGD results noted. Bleeding felt most likely related to gastric angioectasia which was injected and clipped; patient declined colonoscopy to R/O lower source. Apixaban added (see GI note; ok to resume anticoagulation); Hgb stable. Given difficulty controlling rate and symptoms of orthopnea, plan TEE guided DCCV today. Hopefully she will hold sinus now that amiodarone fully loaded. 2 acute blood loss anemia-as outlined above; HGB stable 3 acute renal failure-BUN and creatinine improving. Possibly related to hypoperfusion secondary to anemia/blood loss. Follow renal function. Avoid further  hydration. 4 tobacco abuse-patient counseled on discontinuing.   Olga Millers 03/02/2013, 7:44 AM

## 2013-03-02 NOTE — H&P (View-Only) (Signed)
   Subjective:  Denies CP or dyspnea   Objective:  Filed Vitals:   03/01/13 1003 03/01/13 1440 03/01/13 2100 03/02/13 0504  BP: 110/76 110/82 103/63 121/77  Pulse: 117 99 118 106  Temp:  98.5 F (36.9 C) 97.4 F (36.3 C) 97.8 F (36.6 C)  TempSrc:  Oral Oral Oral  Resp:  19 16 18  Height:      Weight:    109 lb 9.6 oz (49.714 kg)  SpO2:  97% 98% 97%    Intake/Output from previous day:  Intake/Output Summary (Last 24 hours) at 03/02/13 0744 Last data filed at 03/01/13 1806  Gross per 24 hour  Intake    920 ml  Output      0 ml  Net    920 ml    Physical Exam: Physical exam: Well-developed frail in no acute distress.  Skin is warm and dry.  HEENT is normal.  Neck is supple.  Chest with diminished BS bases Cardiovascular exam irregular Abdominal exam nontender or distended. No masses palpated. Extremities show no edema. neuro grossly intact    Lab Results: CBC:  Recent Labs  03/01/13 0500 03/02/13 0446  WBC 11.0* 9.8  HGB 10.4* 10.4*  HCT 33.1* 32.7*  MCV 97.4 97.0  PLT 215 221  Telemetry - atrial flutter  Assessment/Plan:  1 atrial fibrillation/flutter-the patient remains in atrial flutter this AM. Heart rate is elevated. Will continue amiodarone; lopressor on hold for TEE/DCCV. Coumadin and ASA DCed previously because of recent acute anemia from GI blood loss. EGD results noted. Bleeding felt most likely related to gastric angioectasia which was injected and clipped; patient declined colonoscopy to R/O lower source. Apixaban added (see GI note; ok to resume anticoagulation); Hgb stable. Given difficulty controlling rate and symptoms of orthopnea, plan TEE guided DCCV today. Hopefully she will hold sinus now that amiodarone fully loaded. 2 acute blood loss anemia-as outlined above; HGB stable 3 acute renal failure-BUN and creatinine improving. Possibly related to hypoperfusion secondary to anemia/blood loss. Follow renal function. Avoid further  hydration. 4 tobacco abuse-patient counseled on discontinuing.   Sydney Quinn 03/02/2013, 7:44 AM    

## 2013-03-02 NOTE — Preoperative (Signed)
Beta Blockers   Reason not to administer Beta Blockers:Not Applicable 

## 2013-03-03 ENCOUNTER — Encounter (HOSPITAL_COMMUNITY): Payer: Self-pay | Admitting: Cardiology

## 2013-03-03 MED ORDER — AMIODARONE HCL 400 MG PO TABS: ORAL_TABLET | ORAL | Status: DC

## 2013-03-03 NOTE — Progress Notes (Signed)
Physical Therapy Treatment Patient Details Name: Sydney Quinn MRN: [id number] DOB: September 12, 1929 Today's Date: 03/03/2013 Time: 4098-1191 PT Time Calculation (min): 25 min  PT Assessment / Plan / Recommendation  History of Present Illness 79 year old female with history of hypertension, CAD, atrial fibrillation s/p cardioversion on 02/04/2013, was discharged on 02/06/13 from [facility] on amiodarone and Coumadin. Patient presented back to ER with increasing generalized weakness, fatigue and "low energy". Patient lives at home by herself. Patient was accompanied by her son. Patient reports that 3 days after she was discharged, she had dark black melanotic stool but she did not pay much attention at that time. She usually has constipation. However in the last 2-3 days, she's been having poor appetite, increasing generalized weakness, fatigue and dyspnea on exertion.   PT Comments   Pt progressing with mobility with cueing for breathing technique and energy conservation using teach back. Will continue to follow to return pt to PLOF and currently recommend pt use cane initially at DC for safety.  Follow Up Recommendations        Does the patient have the potential to tolerate intense rehabilitation     Barriers to Discharge        Equipment Recommendations       Recommendations for Other Services    Frequency     Progress towards PT Goals Progress towards PT goals: Progressing toward goals  Plan Current plan remains appropriate    Precautions / Restrictions Precautions Precautions: Fall Precaution Comments: watch O2 sats   Pertinent Vitals/Pain sats 88-92% on Ra with gait, 94% at rest HR 77-85 No pain    Mobility  Bed Mobility Bed Mobility: Not assessed Transfers Sit to Stand: 6: Modified independent (Device/Increase time);From chair/3-in-1;With armrests Stand to Sit: 6: Modified independent (Device/Increase time);To chair/3-in-1;With armrests Ambulation/Gait Ambulation/Gait Assistance:  5: Supervision Ambulation Distance (Feet): 600 Feet Assistive device: None Ambulation/Gait Assistance Details: Pt with slight sway x 3 and pt reporting feeling wobbly compared to normal. Sats dropped to 88% on RA with gait with cueing for breathing technique and decreased speed able to increase to 90% Gait Pattern: Step-through pattern;Decreased stride length Gait velocity: quick initially with decrease with cueing to maintain sats Stairs: No    Exercises General Exercises - Lower Extremity Long Arc Quad: AROM;Both;20 reps;Seated Hip ABduction/ADduction: AROM;Both;20 reps;Seated Hip Flexion/Marching: AROM;Both;20 reps;Seated   PT Diagnosis:    PT Problem List:   PT Treatment Interventions:     PT Goals (current goals can now be found in the care plan section)    Visit Information  Last PT Received On: 03/03/13 Assistance Needed: +1 History of Present Illness: 79 year old female with history of hypertension, CAD, atrial fibrillation s/p cardioversion on 02/04/2013, was discharged on 02/06/13 from [facility] on amiodarone and Coumadin. Patient presented back to ER with increasing generalized weakness, fatigue and "low energy". Patient lives at home by herself. Patient was accompanied by her son. Patient reports that 3 days after she was discharged, she had dark black melanotic stool but she did not pay much attention at that time. She usually has constipation. However in the last 2-3 days, she's been having poor appetite, increasing generalized weakness, fatigue and dyspnea on exertion.    Subjective Data      Cognition  Cognition Arousal/Alertness: Awake/alert Behavior During Therapy: WFL for tasks assessed/performed Overall Cognitive Status: Within Functional Limits for tasks assessed    Balance     End of Session PT - End of Session Equipment Utilized During Treatment: Gait belt Activity  Tolerance: Patient tolerated treatment well Patient left: in chair;with call bell/phone within  reach;with family/visitor present Nurse Communication: Mobility status   GP     Sydney Quinn 03/03/2013, 10:07 AM Delaney Meigs, PT [phone]

## 2013-03-03 NOTE — Progress Notes (Signed)
Discharge instructions given to patient and family, all questions answered, patient anxious to leave, remains alert and oriented times 4, NAD noted, able to communicate needs, discharged by wheelchair by NT

## 2013-03-03 NOTE — Progress Notes (Signed)
   Subjective:  Denies CP or dyspnea   Objective:  Filed Vitals:   03/02/13 1333 03/02/13 1341 03/02/13 1417 03/02/13 2045  BP: 132/78 131/86 118/77 90/57  Pulse:   69 76  Temp:   98.6 F (37 C) 98 F (36.7 C)  TempSrc:   Oral Oral  Resp: 14 15 16 16   Height:      Weight:      SpO2: 95% 94% 93% 92%    Intake/Output from previous day:  Intake/Output Summary (Last 24 hours) at 03/03/13 0655 Last data filed at 03/03/13 0117  Gross per 24 hour  Intake   1110 ml  Output    100 ml  Net   1010 ml    Physical Exam: Physical exam: Well-developed frail in no acute distress.  Skin is warm and dry.  HEENT is normal.  Neck is supple.  Chest CTA Cardiovascular exam RRR Abdominal exam nontender or distended. No masses palpated. Extremities show no edema. neuro grossly intact    Lab Results: CBC:  Recent Labs  03/01/13 0500 03/02/13 0446  WBC 11.0* 9.8  HGB 10.4* 10.4*  HCT 33.1* 32.7*  MCV 97.4 97.0  PLT 215 221  Telemetry - atrial flutter  Assessment/Plan:  1 atrial fibrillation/flutter-the patient remains in sinus following DCCV. Will continue amiodarone (continue 400 mg daily for 2 weeks and then change to 200 mg daily). DC lopressor due to bradycardia. Continue Apixaban (see GI note; ok for anticoagulation). OK to DC today and fu with me [place] in 2 weeks; check Hgb at that time. 2 acute blood loss anemia-as outlined above. 3 acute renal failure-BUN and creatinine improving. Possibly related to hypoperfusion secondary to anemia/blood loss.  4 tobacco abuse-patient counseled on discontinuing.   Olga Millers 03/03/2013, 6:55 AM

## 2013-03-10 ENCOUNTER — Encounter: Payer: Self-pay | Admitting: Cardiology

## 2013-03-11 ENCOUNTER — Telehealth: Payer: Self-pay | Admitting: *Deleted

## 2013-03-11 ENCOUNTER — Ambulatory Visit (INDEPENDENT_AMBULATORY_CARE_PROVIDER_SITE_OTHER): Payer: Medicare Other | Admitting: Cardiology

## 2013-03-11 ENCOUNTER — Encounter: Payer: Self-pay | Admitting: Cardiology

## 2013-03-11 VITALS — BP 90/68 | HR 92 | Ht 63.0 in | Wt 106.0 lb

## 2013-03-11 DIAGNOSIS — I4891 Unspecified atrial fibrillation: Secondary | ICD-10-CM

## 2013-03-11 DIAGNOSIS — I059 Rheumatic mitral valve disease, unspecified: Secondary | ICD-10-CM

## 2013-03-11 DIAGNOSIS — I340 Nonrheumatic mitral (valve) insufficiency: Secondary | ICD-10-CM

## 2013-03-11 DIAGNOSIS — I5031 Acute diastolic (congestive) heart failure: Secondary | ICD-10-CM

## 2013-03-11 DIAGNOSIS — K922 Gastrointestinal hemorrhage, unspecified: Secondary | ICD-10-CM

## 2013-03-11 LAB — BASIC METABOLIC PANEL WITH GFR
BUN: 26 mg/dL — ABNORMAL HIGH (ref 6–23)
CO2: 37 mEq/L — ABNORMAL HIGH (ref 19–32)
Calcium: 10.3 mg/dL (ref 8.4–10.5)
Glucose, Bld: 134 mg/dL — ABNORMAL HIGH (ref 70–99)
Potassium: 4.4 mEq/L (ref 3.5–5.3)
Sodium: 140 mEq/L (ref 135–145)

## 2013-03-11 LAB — CBC
HCT: 30.8 % — ABNORMAL LOW (ref 36.0–46.0)
Hemoglobin: 9.8 g/dL — ABNORMAL LOW (ref 12.0–15.0)
MCH: 30 pg (ref 26.0–34.0)
MCHC: 31.8 g/dL (ref 30.0–36.0)
MCV: 94.2 fL (ref 78.0–100.0)
RBC: 3.27 MIL/uL — ABNORMAL LOW (ref 3.87–5.11)

## 2013-03-11 MED ORDER — AMIODARONE HCL 200 MG PO TABS: 200.0000 mg | ORAL_TABLET | Freq: Every day | ORAL | Status: DC

## 2013-03-11 MED ORDER — METOPROLOL SUCCINATE ER 25 MG PO TB24: ORAL_TABLET | ORAL | Status: DC

## 2013-03-11 NOTE — Assessment & Plan Note (Signed)
Patient is euvolemic. Follow closely as she had CHF when in atrial fibrillation previously.

## 2013-03-11 NOTE — Assessment & Plan Note (Addendum)
Patient is back in atrial fibrillation. She has had TEE guided cardioversion twice and has failed to hold sinus rhythm. She has been symptomatic previously but her heart rate was elevated. When she was in the hospital her blood pressure was borderline limiting advancement of AV nodal blocking agents. I will continue amiodarone to help with rate control. I will add low-dose Toprol at 12.5 mg daily. Continue apixaban. Hopefully she will continue to be asymptomatic with rate control and anti-coagulation. If not we would need to consider pacemaker and AV node ablation.

## 2013-03-11 NOTE — Assessment & Plan Note (Signed)
Patient will need followup echocardiograms in the future. 

## 2013-03-11 NOTE — Progress Notes (Signed)
HPI: fu atrial fibrillation. Echocardiogram on 02/03/2013 showed normal LV function, severe left ventricular hypertrophy, mild aortic insufficiency, mild mitral stenosis/mitral regurgitation and mild biatrial enlargement. There is moderate tricuspid regurgitation. Transesophageal echocardiogram on September 10 showed no thrombus. Patient underwent cardioversion on September 10. She was treated with Lovenox/Coumadin and aspirin following the procedure. Readmitted recently with GI bleed. Coumadin and aspirin discontinued. 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 She was treated with transfusion as well. She was noted to be in recurrent atrial flutter/fibrillation. Therefore TEE cardioversion was repeated after resuming apixaban; note the transesophageal echocardiogram showed an ejection fraction of 55-60%, moderate mitral regurgitation, mild aortic insufficiency and biatrial enlargement. There was moderate tricuspid regurgitation.she was then discharged.. Since then, she states her dyspnea on exertion has improved. No orthopnea, PND, pedal edema, palpitations, syncope or chest pain. No melena or hematochezia.   Current Outpatient Prescriptions  Medication Sig Dispense Refill  . amiodarone (PACERONE) 400 MG tablet 200 mg daily      . apixaban (ELIQUIS) 2.5 MG TABS tablet Take 1 tablet (2.5 mg total) by mouth 2 (two) times daily.  60 tablet  0  . aspirin 81 MG tablet Take 81 mg by mouth daily.      . Calcium Carbonate (CALCIUM 600 PO) Take 1 tablet by mouth daily.       . Echinacea 400 MG CAPS Take 1 capsule by mouth daily.       . fish oil-omega-3 fatty acids 1000 MG capsule Take 1 g by mouth daily.      Marland Kitchen gabapentin (NEURONTIN) 100 MG capsule Take 500 mg by mouth at bedtime.      . isosorbide mononitrate (IMDUR) 60 MG 24 hr tablet Take 60 mg by mouth daily.      . Levothyroxine Sodium 88 MCG CAPS Take by mouth daily before  breakfast.      . simvastatin (ZOCOR) 40 MG tablet TAKE 1 TABLET DAILY  30 tablet  0  . traMADol (ULTRAM) 50 MG tablet Take 50 mg by mouth every 6 (six) hours as needed for pain.       No current facility-administered medications for this visit.     Past Medical History  Diagnosis Date  . Pancreatitis     pt not aware of this dx  . Hypertension   . Myocardial infarction   . Hypothyroid   . High cholesterol   . PAF (paroxysmal atrial fibrillation)     a. 01/2013 s/p TEE/DCCV (EF 65-70%, sev conc LVH, mod TR, no thrombus).  . Collagen vascular disease   . Diabetes mellitus without complication   . Nephrolithiasis     Bil. by CT of 2012  . PVD (peripheral vascular disease)   . Atrial flutter     Past Surgical History  Procedure Laterality Date  . Vascular surgery  1999    s/p mesenteric bypass.  in [facility].   Marland Kitchen Appendectomy    . Tee without cardioversion N/A 02/04/2013    Procedure: TRANSESOPHAGEAL ECHOCARDIOGRAM (TEE);  Surgeon: Vesta Mixer, MD;  Location: [facility] ENDOSCOPY;  Service: Cardiovascular;  Laterality: N/A;  . Cardioversion N/A 02/04/2013    Procedure: CARDIOVERSION;  Surgeon: Vesta Mixer, MD;  Location: [facility] ENDOSCOPY;  Service: Cardiovascular;  Laterality: N/A;  . Partial colectomy  1999    had this following mesenteric bypass for what sounds like ischemic colitis.   Marland Kitchen Splenectomy  1999  pt recalls this was done at time of colectomy.  . Esophagogastroduodenoscopy N/A 02/25/2013    Procedure: ESOPHAGOGASTRODUODENOSCOPY (EGD);  Surgeon: Beverley Fiedler, MD;  Location: [facility] ENDOSCOPY;  Service: Endoscopy;  Laterality: N/A;  . Argon laser application  02/25/2013    Procedure: ARGON LASER APPLICATION;  Surgeon: Beverley Fiedler, MD;  Location: [facility] ENDOSCOPY;  Service: Endoscopy;;  . Rhae Hammock without cardioversion N/A 03/02/2013    Procedure: TRANSESOPHAGEAL ECHOCARDIOGRAM (TEE);  Surgeon: Lewayne Bunting, MD;  Location: [facility] ENDOSCOPY;  Service: Cardiovascular;  Laterality: N/A;  .  Cardioversion N/A 03/02/2013    Procedure: CARDIOVERSION;  Surgeon: Lewayne Bunting, MD;  Location: [facility] ENDOSCOPY;  Service: Cardiovascular;  Laterality: N/A;    History   Social History  . Marital Status: Widowed    Spouse Name: N/A    Number of Children: 2  . Years of Education: N/A   Occupational History  . Not on file.   Social History Main Topics  . Smoking status: Former Smoker -- 0.25 packs/day    Quit date: 02/16/2013  . Smokeless tobacco: Not on file  . Alcohol Use: No  . Drug Use: No  . Sexual Activity: Not on file   Other Topics Concern  . Not on file   Social History Narrative  . No narrative on file    ROS: no fevers or chills, productive cough, hemoptysis, dysphasia, odynophagia, melena, hematochezia, dysuria, hematuria, rash, seizure activity, orthopnea, PND, pedal edema, claudication. Remaining systems are negative.  Physical Exam: Well-developed frail in no acute distress.  Skin is warm and dry.  HEENT is normal.  Neck is supple.  Chest is clear to auscultation with normal expansion.  Cardiovascular exam is irregular Abdominal exam nontender or distended. No masses palpated. Extremities show no edema. neuro grossly intact  ECG atrial fibrillation at a rate of 92. Right bundle branch block. Left ventricular hypertrophy. Nonspecific T-wave changes.

## 2013-03-11 NOTE — Patient Instructions (Signed)
Your physician recommends that you schedule a follow-up appointment in: 4-6 WEEKS WITH DR CRENSHAW IN [place]  CONTINUE AMIODARONE  START METOPROLOL SUCC 25 MG TAKE ONE HALF TABLET ONCE DAILY  STOP ASPIRIN  Your physician recommends that you return for lab work TODAY

## 2013-03-11 NOTE — Telephone Encounter (Signed)
Prior auth obtained for eliquis x one year. Pt made aware

## 2013-03-11 NOTE — Assessment & Plan Note (Signed)
Corrected recently as outlined in history of present illness. Recheck hemoglobin. Will also check her renal function. Discontinue aspirin.

## 2013-03-12 ENCOUNTER — Telehealth: Payer: Self-pay | Admitting: *Deleted

## 2013-03-12 NOTE — Telephone Encounter (Signed)
[employer] Rx approval letter on eliquis 2.5 mg tablet through 10/15/205, [id number]

## 2013-03-30 ENCOUNTER — Telehealth: Payer: Self-pay | Admitting: *Deleted

## 2013-04-02 ENCOUNTER — Other Ambulatory Visit: Payer: Self-pay | Admitting: *Deleted

## 2013-04-02 MED ORDER — APIXABAN 2.5 MG PO TABS: 2.5000 mg | ORAL_TABLET | Freq: Two times a day (BID) | ORAL | Status: DC

## 2013-04-02 NOTE — Telephone Encounter (Signed)
Refill complete 

## 2013-04-06 ENCOUNTER — Ambulatory Visit: Payer: Medicare Other | Admitting: Internal Medicine

## 2013-04-11 ENCOUNTER — Encounter (HOSPITAL_BASED_OUTPATIENT_CLINIC_OR_DEPARTMENT_OTHER): Payer: Self-pay | Admitting: Emergency Medicine

## 2013-04-11 ENCOUNTER — Emergency Department (HOSPITAL_BASED_OUTPATIENT_CLINIC_OR_DEPARTMENT_OTHER): Payer: Medicare Other

## 2013-04-11 ENCOUNTER — Observation Stay (HOSPITAL_COMMUNITY): Payer: Medicare Other

## 2013-04-11 ENCOUNTER — Inpatient Hospital Stay (HOSPITAL_BASED_OUTPATIENT_CLINIC_OR_DEPARTMENT_OTHER)
Admission: EM | Admit: 2013-04-11 | Discharge: 2013-04-13 | DRG: 064 | Disposition: A | Discharge status: Home Health | Payer: Medicare Other | Attending: Internal Medicine | Admitting: Internal Medicine

## 2013-04-11 DIAGNOSIS — E7800 Pure hypercholesterolemia, unspecified: Secondary | ICD-10-CM

## 2013-04-11 DIAGNOSIS — Z79899 Other long term (current) drug therapy: Secondary | ICD-10-CM

## 2013-04-11 DIAGNOSIS — R42 Dizziness and giddiness: Secondary | ICD-10-CM

## 2013-04-11 DIAGNOSIS — D62 Acute posthemorrhagic anemia: Secondary | ICD-10-CM

## 2013-04-11 DIAGNOSIS — E119 Type 2 diabetes mellitus without complications: Secondary | ICD-10-CM | POA: Diagnosis present

## 2013-04-11 DIAGNOSIS — I6340 Cerebral infarction due to embolism of unspecified cerebral artery: Principal | ICD-10-CM | POA: Diagnosis present

## 2013-04-11 DIAGNOSIS — I10 Essential (primary) hypertension: Secondary | ICD-10-CM | POA: Diagnosis present

## 2013-04-11 DIAGNOSIS — I4891 Unspecified atrial fibrillation: Secondary | ICD-10-CM

## 2013-04-11 DIAGNOSIS — K31819 Angiodysplasia of stomach and duodenum without bleeding: Secondary | ICD-10-CM | POA: Diagnosis present

## 2013-04-11 DIAGNOSIS — I509 Heart failure, unspecified: Secondary | ICD-10-CM | POA: Diagnosis present

## 2013-04-11 DIAGNOSIS — N289 Disorder of kidney and ureter, unspecified: Secondary | ICD-10-CM

## 2013-04-11 DIAGNOSIS — I739 Peripheral vascular disease, unspecified: Secondary | ICD-10-CM | POA: Diagnosis present

## 2013-04-11 DIAGNOSIS — E039 Hypothyroidism, unspecified: Secondary | ICD-10-CM

## 2013-04-11 DIAGNOSIS — I4510 Unspecified right bundle-branch block: Secondary | ICD-10-CM | POA: Diagnosis present

## 2013-04-11 DIAGNOSIS — I4892 Unspecified atrial flutter: Secondary | ICD-10-CM | POA: Diagnosis present

## 2013-04-11 DIAGNOSIS — I2510 Atherosclerotic heart disease of native coronary artery without angina pectoris: Secondary | ICD-10-CM | POA: Diagnosis present

## 2013-04-11 DIAGNOSIS — I5030 Unspecified diastolic (congestive) heart failure: Secondary | ICD-10-CM

## 2013-04-11 DIAGNOSIS — R269 Unspecified abnormalities of gait and mobility: Secondary | ICD-10-CM | POA: Diagnosis present

## 2013-04-11 DIAGNOSIS — H532 Diplopia: Secondary | ICD-10-CM | POA: Diagnosis present

## 2013-04-11 DIAGNOSIS — I340 Nonrheumatic mitral (valve) insufficiency: Secondary | ICD-10-CM

## 2013-04-11 DIAGNOSIS — K922 Gastrointestinal hemorrhage, unspecified: Secondary | ICD-10-CM | POA: Diagnosis present

## 2013-04-11 DIAGNOSIS — I959 Hypotension, unspecified: Secondary | ICD-10-CM | POA: Diagnosis present

## 2013-04-11 DIAGNOSIS — Z87891 Personal history of nicotine dependence: Secondary | ICD-10-CM

## 2013-04-11 DIAGNOSIS — D649 Anemia, unspecified: Secondary | ICD-10-CM

## 2013-04-11 DIAGNOSIS — I5031 Acute diastolic (congestive) heart failure: Secondary | ICD-10-CM

## 2013-04-11 DIAGNOSIS — E785 Hyperlipidemia, unspecified: Secondary | ICD-10-CM

## 2013-04-11 DIAGNOSIS — E876 Hypokalemia: Secondary | ICD-10-CM

## 2013-04-11 DIAGNOSIS — I059 Rheumatic mitral valve disease, unspecified: Secondary | ICD-10-CM | POA: Diagnosis present

## 2013-04-11 DIAGNOSIS — I252 Old myocardial infarction: Secondary | ICD-10-CM

## 2013-04-11 DIAGNOSIS — E46 Unspecified protein-calorie malnutrition: Secondary | ICD-10-CM

## 2013-04-11 DIAGNOSIS — I639 Cerebral infarction, unspecified: Secondary | ICD-10-CM

## 2013-04-11 DIAGNOSIS — E41 Nutritional marasmus: Secondary | ICD-10-CM | POA: Diagnosis present

## 2013-04-11 DIAGNOSIS — I5032 Chronic diastolic (congestive) heart failure: Secondary | ICD-10-CM | POA: Diagnosis present

## 2013-04-11 HISTORY — DX: Unspecified diastolic (congestive) heart failure: I50.30

## 2013-04-11 HISTORY — DX: Cerebral infarction, unspecified: I63.9

## 2013-04-11 LAB — URINALYSIS, ROUTINE W REFLEX MICROSCOPIC
Bilirubin Urine: NEGATIVE
Glucose, UA: NEGATIVE mg/dL
Hgb urine dipstick: NEGATIVE
Ketones, ur: NEGATIVE mg/dL
Leukocytes, UA: NEGATIVE
Protein, ur: 30 mg/dL — AB
pH: 8 (ref 5.0–8.0)

## 2013-04-11 LAB — BASIC METABOLIC PANEL
BUN: 17 mg/dL (ref 6–23)
CO2: 28 mEq/L (ref 19–32)
Calcium: 9.6 mg/dL (ref 8.4–10.5)
Creatinine, Ser: 1.2 mg/dL — ABNORMAL HIGH (ref 0.50–1.10)
GFR calc non Af Amer: 41 mL/min — ABNORMAL LOW (ref >90)
Glucose, Bld: 162 mg/dL — ABNORMAL HIGH (ref 70–99)

## 2013-04-11 LAB — CBC WITH DIFFERENTIAL/PLATELET
Basophils Relative: 0 % (ref 0–1)
Eosinophils Absolute: 0 10*3/uL (ref 0.0–0.7)
Eosinophils Relative: 0 % (ref 0–5)
HCT: 30.8 % — ABNORMAL LOW (ref 36.0–46.0)
Hemoglobin: 9.6 g/dL — ABNORMAL LOW (ref 12.0–15.0)
Lymphs Abs: 0.7 10*3/uL (ref 0.7–4.0)
MCH: 28.7 pg (ref 26.0–34.0)
MCHC: 31.2 g/dL (ref 30.0–36.0)
MCV: 91.9 fL (ref 78.0–100.0)
Monocytes Absolute: 0.3 10*3/uL (ref 0.1–1.0)
Monocytes Relative: 4 % (ref 3–12)
RBC: 3.35 MIL/uL — ABNORMAL LOW (ref 3.87–5.11)

## 2013-04-11 LAB — PROTIME-INR
INR: 1.63 — ABNORMAL HIGH (ref 0.00–1.49)
Prothrombin Time: 18.9 seconds — ABNORMAL HIGH (ref 11.6–15.2)

## 2013-04-11 LAB — TROPONIN I: Troponin I: <0.3 ng/mL (ref <0.30)

## 2013-04-11 LAB — URINE MICROSCOPIC-ADD ON

## 2013-04-11 MED ORDER — ONDANSETRON HCL 4 MG/2ML IJ SOLN: 4.0000 mg | Freq: Once | INTRAMUSCULAR | Status: DC

## 2013-04-11 MED ORDER — SENNOSIDES-DOCUSATE SODIUM 8.6-50 MG PO TABS: 1.0000 | ORAL_TABLET | Freq: Every evening | ORAL | Status: DC | PRN

## 2013-04-11 MED ORDER — ONDANSETRON HCL 4 MG/2ML IJ SOLN: 4.0000 mg | Freq: Three times a day (TID) | INTRAMUSCULAR | Status: AC | PRN

## 2013-04-11 NOTE — ED Notes (Signed)
MD at bedside. 

## 2013-04-11 NOTE — ED Notes (Signed)
Pt having emesis after breakfast today.  Pt having severe weakness since September, but worse this last week.

## 2013-04-11 NOTE — Consult Note (Signed)
Neurology Consultation Reason for Consult: Vertigo Referring Physician: Toy Cookey  CC: Vertigo  History is obtained from: Patient  HPI: Sydney Quinn is a 79 y.o. female with a history of atrial fibrillation who presents with sudden onset vertigo earlier today. She got up in her normal state, but subsequently around 9:30 began having a spinning sensation then became unsteady. She also developed diplopia at this time. She states that she does not remember clearly whether she was able to walk but states that she felt that she needed a wheelchair to get into the ED earlier.  She states that the symptoms have improved greatly, but she still has some mild symptoms. She no longer has any diplopia   LKW: 9:30 AM tpa given?: no, outside of window, mild symptoms NIHSS: 1   ROS: A 14 point ROS was performed and is negative except as noted in the HPI.  Past Medical History  Diagnosis Date  . Pancreatitis     pt not aware of this dx  . Hypertension   . Myocardial infarction   . Hypothyroid   . High cholesterol   . PAF (paroxysmal atrial fibrillation)     a. 01/2013 s/p TEE/DCCV (EF 65-70%, sev conc LVH, mod TR, no thrombus).  . Collagen vascular disease   . Diabetes mellitus without complication   . Nephrolithiasis     Bil. by CT of 2012  . PVD (peripheral vascular disease)   . Atrial flutter   . Diastolic CHF 04/11/2013    Family History: No history of stroke  Social History: Tob: Denies  Exam: Current vital signs: BP 96/70  Pulse 107  Temp(Src) 98.4 F (36.9 C) (Oral)  Resp 18  SpO2 95% Vital signs in last 24 hours: Temp:  [98.3 F (36.8 C)-98.4 F (36.9 C)] 98.4 F (36.9 C) (11/15 2104) Pulse Rate:  [78-108] 107 (11/15 2104) Resp:  [15-18] 18 (11/15 2104) BP: (96-136)/(69-98) 96/70 mmHg (11/15 2104) SpO2:  [95 %-98 %] 95 % (11/15 2104)  General: In bed, NAD CV: irregular Mental Status: Patient is awake, alert, oriented to person, place, month, year, and  situation. Immediate and remote memory are intact. Patient is able to give a clear and coherent history. No signs of aphasia or neglect Cranial Nerves: II: Visual Fields are full. Pupils are equal, round, and reactive to light.  Discs are difficult to visualize. III,IV, VI: EOMI without ptosis or diploplia.  V: Facial sensation is symmetric to temperature VII: Facial movement is symmetric.  VIII: hearing is intact to voice X: Uvula elevates symmetrically XI: Shoulder shrug is symmetric. XII: tongue is midline without atrophy or fasciculations.  Motor: Tone is normal. Bulk is normal. 5/5 strength was present in all four extremities.  Sensory: Sensation is symmetric to light touch and temperature in the arms and legs. Deep Tendon Reflexes: 2+ and symmetric in the biceps and patellae.  Cerebellar: FNF and HKS are intact on the right, though with mild endpoint tremor on finger-nose-finger without passpointing. on the left she has a slower finger-nose-finger, but no clear passpointing. She has significant difficulty with heel-knee-shin on the left, appearing ataxic Gait: Patient has mild unsteadiness, but is able to walk unassisted  I have reviewed labs in epic and the results pertinent to this consultation are: CBC-anemia BMP is mildly elevated creatinine  I have reviewed the images obtained: CT head-no acute findings  Impression: 79 year old female with new onset vertigo and diplopia that is mildly improved over the course of the day. I suspect  that she has had a small posterior circulation stroke and she is being admitted for workup of this.  Recommendations: 1. HgbA1c, fasting lipid panel 2. MRI, MRA  of the brain without contrast 3. Frequent neuro checks 4. Echocardiogram 5. Carotid dopplers 6. Prophylactic therapy-Antiplatelet med: Aspirin - dose 325mg , would hold elequis until MRI to establish presence/size of infarct 7. Risk factor modification 8. Telemetry monitoring 9.  PT consult, OT consult, Speech consult    Ritta Slot, MD [facility] [phone]  If 7pm- 7am, please page neurology on call at [phone].

## 2013-04-11 NOTE — H&P (Signed)
[facility] History and Physical  Otto Caraway YNW:[id number] DOB: 1929-10-10 DOA: 04/11/2013  Referring physician: EDP PCP: Verlon Au, MD  Specialists:   Chief Complaint:   HPI: Sydney Quinn is a 79 y.o. female who presented to the [facility] ED with complaints of sudden onset of dizziness  along with double vision and feeling as if she was going to fall down.   Her symptoms began in the AM and she had vomiting when she ate at noon.   She denies having any headache or chest pain or syncope.   Her symptoms were worse with standing up and turning her head, and she reports no dizziness when she lays down.   She was evaluated in the ED and a CT scan was performed and was found to be negative for acute findings, she was transfered to [facility] for further evaluation to rule out a posterior circulation CVA.    Of Note, she reports not feeling well  For months and has had increased weakness since her diagnosis of atrial fibrillation.   She has also had poor intake of foods and liquids but her weight has remained stable.      Review of Systems: The patient denies anorexia, fever, chills, headaches, weight loss, vision loss, diplopia, dizziness, decreased hearing, rhinitis, hoarseness, chest pain, syncope, dyspnea on exertion, peripheral edema, balance deficits, cough, hemoptysis, abdominal pain, nausea, vomiting, diarrhea, constipation, hematemesis, melena, hematochezia, severe indigestion/heartburn, dysuria, hematuria, incontinence, muscle weakness, suspicious skin lesions, transient blindness, difficulty walking, depression, unusual weight change, abnormal bleeding, enlarged lymph nodes, angioedema, and breast masses.    Past Medical History  Diagnosis Date  . Pancreatitis     pt not aware of this dx  . Hypertension   . Myocardial infarction   . Hypothyroid   . High cholesterol   . PAF (paroxysmal atrial fibrillation)     a. 01/2013 s/p TEE/DCCV (EF 65-70%, sev conc LVH,  mod TR, no thrombus).  . Collagen vascular disease   . Diabetes mellitus without complication   . Nephrolithiasis     Bil. by CT of 2012  . PVD (peripheral vascular disease)   . Atrial flutter   . Diastolic CHF 04/11/2013    Past Surgical History  Procedure Laterality Date  . Vascular surgery  1999    s/p mesenteric bypass.  in [facility].   Marland Kitchen Appendectomy    . Tee without cardioversion N/A 02/04/2013    Procedure: TRANSESOPHAGEAL ECHOCARDIOGRAM (TEE);  Surgeon: Vesta Mixer, MD;  Location: [facility] ENDOSCOPY;  Service: Cardiovascular;  Laterality: N/A;  . Cardioversion N/A 02/04/2013    Procedure: CARDIOVERSION;  Surgeon: Vesta Mixer, MD;  Location: [facility] ENDOSCOPY;  Service: Cardiovascular;  Laterality: N/A;  . Partial colectomy  1999    had this following mesenteric bypass for what sounds like ischemic colitis.   Marland Kitchen Splenectomy  1999    pt recalls this was done at time of colectomy.  . Esophagogastroduodenoscopy N/A 02/25/2013    Procedure: ESOPHAGOGASTRODUODENOSCOPY (EGD);  Surgeon: Beverley Fiedler, MD;  Location: [facility] ENDOSCOPY;  Service: Endoscopy;  Laterality: N/A;  . Argon laser application  02/25/2013    Procedure: ARGON LASER APPLICATION;  Surgeon: Beverley Fiedler, MD;  Location: [facility] ENDOSCOPY;  Service: Endoscopy;;  . Rhae Hammock without cardioversion N/A 03/02/2013    Procedure: TRANSESOPHAGEAL ECHOCARDIOGRAM (TEE);  Surgeon: Lewayne Bunting, MD;  Location: [facility] ENDOSCOPY;  Service: Cardiovascular;  Laterality: N/A;  . Cardioversion N/A 03/02/2013    Procedure: CARDIOVERSION;  Surgeon:  Lewayne Bunting, MD;  Location: [facility] ENDOSCOPY;  Service: Cardiovascular;  Laterality: N/A;    Prior to Admission medications   Medication Sig Start Date End Date Taking? Authorizing Provider  amiodarone (PACERONE) 200 MG tablet Take 1 tablet (200 mg total) by mouth daily. 03/11/13  Yes Lewayne Bunting, MD  apixaban (ELIQUIS) 2.5 MG TABS tablet Take 1 tablet (2.5 mg total) by mouth 2 (two) times daily. 04/02/13   Yes Lewayne Bunting, MD  Calcium Carbonate (CALCIUM 600 PO) Take 600 mg by mouth 2 (two) times daily.    Yes Historical Provider, MD  Echinacea 400 MG CAPS Take 400 mg by mouth 3 (three) times a week. Mon, wed and fri   Yes Historical Provider, MD  fish oil-omega-3 fatty acids 1000 MG capsule Take 1 g by mouth daily.   Yes Historical Provider, MD  gabapentin (NEURONTIN) 300 MG capsule Take 600 mg by mouth at bedtime.   Yes Historical Provider, MD  Levothyroxine Sodium 88 MCG CAPS Take 88 mcg by mouth daily before breakfast.    Yes Historical Provider, MD  metoprolol succinate (TOPROL-XL) 25 MG 24 hr tablet Take 12.5 mg by mouth daily.   Yes Historical Provider, MD  simvastatin (ZOCOR) 40 MG tablet Take 40 mg by mouth daily at 6 PM.   Yes Historical Provider, MD  traMADol (ULTRAM) 50 MG tablet Take 50 mg by mouth every 6 (six) hours as needed for pain.   Yes Historical Provider, MD    Allergies  Allergen Reactions  . Biaxin [Clarithromycin] Rash    Social History:  reports that she quit smoking about 7 weeks ago. She does not have any smokeless tobacco history on file. She reports that she does not drink alcohol or use illicit drugs.     Family History  Problem Relation Age of Onset  . Heart disease      No family history   Diabetes in Mother  PVD/ASCVD in Father     Physical Exam:  GEN:  Pleasant Well Nourished and Well Developed Elderly    79 y.o. Caucasian female  examined  and in no acute distress; cooperative with exam Filed Vitals:   04/11/13 1658 04/11/13 1827 04/11/13 1941 04/11/13 2104  BP: 124/69  119/73 96/70  Pulse: 105  78 107  Temp:  98.3 F (36.8 C)  98.4 F (36.9 C)  TempSrc:    Oral  Resp: 15  15 18   SpO2: 97%  95% 95%   Blood pressure 96/70, pulse 107, temperature 98.4 F (36.9 C), temperature source Oral, resp. rate 18, SpO2 95.00%. PSYCH: She is alert and oriented x4; does not appear anxious does not appear depressed; affect is normal HEENT:  Normocephalic and Atraumatic, Mucous membranes pink; PERRLA; EOM intact; Fundi:  Benign;  No scleral icterus, Nares: Patent, Oropharynx: Clear, Fair Dentition, Neck:  FROM, no cervical lymphadenopathy nor thyromegaly or carotid bruit; no JVD; Breasts:: Not examined CHEST WALL: No tenderness CHEST: Normal respiration, clear to auscultation bilaterally HEART: Regular rate and rhythm; no murmurs rubs or gallops BACK: No kyphosis or scoliosis; no CVA tenderness ABDOMEN: Positive Bowel Sounds, soft non-tender; no masses, no organomegaly. Rectal Exam: Not done EXTREMITIES: No cyanosis, clubbing or edema; no ulcerations. Genitalia: not examined PULSES: 2+ and symmetric SKIN: Normal hydration no rash or ulceration CNS: Cranial nerves 2-12 grossly intact no focal neurologic deficit    Labs on Admission:  Basic Metabolic Panel:  Recent Labs Lab 04/11/13 1405  NA 141  K 3.3*  CL 100  CO2 28  GLUCOSE 162*  BUN 17  CREATININE 1.20*  CALCIUM 9.6   Liver Function Tests: No results found for this basename: AST, ALT, ALKPHOS, BILITOT, PROT, ALBUMIN,  in the last 168 hours No results found for this basename: LIPASE, AMYLASE,  in the last 168 hours No results found for this basename: AMMONIA,  in the last 168 hours CBC:  Recent Labs Lab 04/11/13 1405  WBC 6.8  NEUTROABS 5.8  HGB 9.6*  HCT 30.8*  MCV 91.9  PLT 232   Cardiac Enzymes:  Recent Labs Lab 04/11/13 1405  TROPONINI <0.30    BNP (last 3 results) No results found for this basename: PROBNP,  in the last 8760 hours CBG: No results found for this basename: GLUCAP,  in the last 168 hours  Radiological Exams on Admission: Ct Head Wo Contrast  04/11/2013   CLINICAL DATA:  Increased weakness in September. Vomiting this morning. History of hypertension, diabetes, and PVD.  EXAM: CT HEAD WITHOUT CONTRAST  TECHNIQUE: Contiguous axial images were obtained from the base of the skull through the vertex without intravenous  contrast.  COMPARISON:  None available  FINDINGS: There is central and cortical atrophy. Periventricular white matter changes are consistent with small vessel disease. There is no evidence for hemorrhage, mass lesion, or acute infarction.  Bone windows show no calvarial fracture. There is atherosclerotic calcification of the internal carotid arteries.  IMPRESSION: 1. Atrophy and small vessel disease. 2.  No evidence for acute intracranial abnormality.   Electronically Signed   By: Rosalie Gums M.D.   On: 04/11/2013 15:26     EKG: Independently reviewed. Atrial Fibrillation, RBBB present   Assessment/Plan Principal Problem:   Vertigo Active Problems:   Hypotension, unspecified   A-fib   Diastolic CHF   Other and unspecified hyperlipidemia   Normocytic anemia    1.   Vertigo- rule Out CVA, MRI/MRA of Brain , 2-D ECHO and Carotid Doppler in AM.   Check Orthostatics, and placed on Fall Precautions.     2.   Hypotension-  Hold BP Meds and diuretic,  Gentle Rehydration.    3.   Atrial Fibrillation-  On  Amiodarone, Toprol, and Eliquis Rx.    4.  Diastolic CHF- Chronic, Monitor for S/Sx of Fluid Overload.    5.  Hyperlipidemia-  Continue simvastatin, Check Fasting Lipids in AM.    6.  Normocytic Anemia-  Check Anemia Panel.      Code Status:   FULL CODE Family Communication:    Son at Bedside Disposition Plan:    Observation  Time spent:  69 Minutes  Ron Parker [facility] Pager [phone]  If 7PM-7AM, please contact night-coverage [email or web address] Password [email or web address] 04/11/2013, 9:40 PM

## 2013-04-11 NOTE — ED Provider Notes (Signed)
CSN: [id number]     Arrival date & time 04/11/13  1341 History   First MD Initiated Contact with Patient 04/11/13 1429     Chief Complaint  Patient presents with  . Emesis  . Weakness   (Consider location/radiation/quality/duration/timing/severity/associated sxs/prior Treatment) HPI Comments: Pt is a 79 y.o. female with Pmhx as above who presents with sudden onset dizziness described as feeling like she would fall down and couldn't keep her eyes open a 9am this morning, about 30 min after waking.  She also began having diplopia at this time. Symptoms have continued, though is improved with laying still.  She denies numbness, weakness, fever, h/a, chills, CP, ab pain, leg pain or swelling. Unable to describe if diplopia with vertical or horizontal.   Patient is a 79 y.o. female presenting with neurologic complaint. The history is provided by the patient. No language interpreter was used.  Neurologic Problem This is a new problem. The current episode started 3 to 5 hours ago. The problem occurs constantly. The problem has not changed since onset.Pertinent negatives include no chest pain, no abdominal pain, no headaches and no shortness of breath. Exacerbated by: standing. The symptoms are relieved by rest. She has tried nothing for the symptoms. The treatment provided mild relief.    Past Medical History  Diagnosis Date  . Pancreatitis     pt not aware of this dx  . Hypertension   . Myocardial infarction   . Hypothyroid   . High cholesterol   . PAF (paroxysmal atrial fibrillation)     a. 01/2013 s/p TEE/DCCV (EF 65-70%, sev conc LVH, mod TR, no thrombus).  . Collagen vascular disease   . Diabetes mellitus without complication   . Nephrolithiasis     Bil. by CT of 2012  . PVD (peripheral vascular disease)   . Atrial flutter    Past Surgical History  Procedure Laterality Date  . Vascular surgery  1999    s/p mesenteric bypass.  in [facility].   Marland Kitchen Appendectomy    . Tee without  cardioversion N/A 02/04/2013    Procedure: TRANSESOPHAGEAL ECHOCARDIOGRAM (TEE);  Surgeon: Vesta Mixer, MD;  Location: [facility] ENDOSCOPY;  Service: Cardiovascular;  Laterality: N/A;  . Cardioversion N/A 02/04/2013    Procedure: CARDIOVERSION;  Surgeon: Vesta Mixer, MD;  Location: [facility] ENDOSCOPY;  Service: Cardiovascular;  Laterality: N/A;  . Partial colectomy  1999    had this following mesenteric bypass for what sounds like ischemic colitis.   Marland Kitchen Splenectomy  1999    pt recalls this was done at time of colectomy.  . Esophagogastroduodenoscopy N/A 02/25/2013    Procedure: ESOPHAGOGASTRODUODENOSCOPY (EGD);  Surgeon: Beverley Fiedler, MD;  Location: [facility] ENDOSCOPY;  Service: Endoscopy;  Laterality: N/A;  . Argon laser application  02/25/2013    Procedure: ARGON LASER APPLICATION;  Surgeon: Beverley Fiedler, MD;  Location: [facility] ENDOSCOPY;  Service: Endoscopy;;  . Rhae Hammock without cardioversion N/A 03/02/2013    Procedure: TRANSESOPHAGEAL ECHOCARDIOGRAM (TEE);  Surgeon: Lewayne Bunting, MD;  Location: [facility] ENDOSCOPY;  Service: Cardiovascular;  Laterality: N/A;  . Cardioversion N/A 03/02/2013    Procedure: CARDIOVERSION;  Surgeon: Lewayne Bunting, MD;  Location: [facility] ENDOSCOPY;  Service: Cardiovascular;  Laterality: N/A;   Family History  Problem Relation Age of Onset  . Heart disease      No family history   History  Substance Use Topics  . Smoking status: Former Smoker -- 0.25 packs/day    Quit date: 02/16/2013  . Smokeless  tobacco: Not on file  . Alcohol Use: No   OB History   Grav Para Term Preterm Abortions TAB SAB Ect Mult Living                 Review of Systems  Constitutional: Negative for fever, chills, diaphoresis, activity change, appetite change and fatigue.  HENT: Negative for congestion, facial swelling, rhinorrhea and sore throat.   Eyes: Positive for visual disturbance. Negative for photophobia and discharge.  Respiratory: Negative for cough, chest tightness and shortness of breath.    Cardiovascular: Negative for chest pain, palpitations and leg swelling.  Gastrointestinal: Negative for nausea, vomiting, abdominal pain and diarrhea.  Endocrine: Negative for polydipsia and polyuria.  Genitourinary: Negative for dysuria, frequency, difficulty urinating and pelvic pain.  Musculoskeletal: Negative for arthralgias, back pain, neck pain and neck stiffness.  Skin: Negative for color change and wound.  Allergic/Immunologic: Negative for immunocompromised state.  Neurological: Positive for dizziness. Negative for facial asymmetry, speech difficulty, weakness, numbness and headaches.  Hematological: Does not bruise/bleed easily.  Psychiatric/Behavioral: Negative for confusion and agitation.    Allergies  Biaxin  Home Medications   Current Outpatient Rx  Name  Route  Sig  Dispense  Refill  . amiodarone (PACERONE) 200 MG tablet   Oral   Take 1 tablet (200 mg total) by mouth daily.   90 tablet   3   . apixaban (ELIQUIS) 2.5 MG TABS tablet   Oral   Take 1 tablet (2.5 mg total) by mouth 2 (two) times daily.   60 tablet   11   . Calcium Carbonate (CALCIUM 600 PO)   Oral   Take 1 tablet by mouth daily.          . Echinacea 400 MG CAPS   Oral   Take 1 capsule by mouth daily.          . fish oil-omega-3 fatty acids 1000 MG capsule   Oral   Take 1 g by mouth daily.         Marland Kitchen gabapentin (NEURONTIN) 100 MG capsule   Oral   Take 500 mg by mouth at bedtime.         . Levothyroxine Sodium 88 MCG CAPS   Oral   Take by mouth daily before breakfast.         . metoprolol succinate (TOPROL XL) 25 MG 24 hr tablet      Take one half tablet once daily   30 tablet   12   . simvastatin (ZOCOR) 40 MG tablet      TAKE 1 TABLET DAILY   30 tablet   0     Needs office visit/labs!   Marland Kitchen traMADol (ULTRAM) 50 MG tablet   Oral   Take 50 mg by mouth every 6 (six) hours as needed for pain.          BP 128/98  Pulse 102  Temp(Src) 98.3 F (36.8 C) (Oral)   Resp 16  SpO2 98% Physical Exam  Constitutional: She is oriented to person, place, and time. She appears well-developed and well-nourished. No distress.  HENT:  Head: Normocephalic and atraumatic.  Mouth/Throat: No oropharyngeal exudate.  horizontal nystagmus, but nml EOM.   Eyes: Pupils are equal, round, and reactive to light.  Neck: Normal range of motion. Neck supple.  Cardiovascular: Normal rate, regular rhythm and normal heart sounds.  Exam reveals no gallop and no friction rub.   No murmur heard. Pulmonary/Chest: Effort normal and breath sounds  normal. No respiratory distress. She has no wheezes. She has no rales.  Abdominal: Soft. Bowel sounds are normal. She exhibits no distension and no mass. There is no tenderness. There is no rebound and no guarding.  Musculoskeletal: Normal range of motion. She exhibits no edema and no tenderness.  Neurological: She is alert and oriented to person, place, and time. She has normal strength. She displays no atrophy and no tremor. No cranial nerve deficit or sensory deficit. She exhibits normal muscle tone. She displays no seizure activity. Gait abnormal. Coordination normal. GCS eye subscore is 4. GCS verbal subscore is 5. GCS motor subscore is 6.  Unable to ambulate due to vertigo, +romberg  Skin: Skin is warm and dry.  Psychiatric: She has a normal mood and affect.    ED Course  Procedures (including critical care time) Labs Review Labs Reviewed  CBC WITH DIFFERENTIAL - Abnormal; Notable for the following:    RBC 3.35 (*)    Hemoglobin 9.6 (*)    HCT 30.8 (*)    RDW 16.1 (*)    Neutrophils Relative % 85 (*)    Lymphocytes Relative 11 (*)    All other components within normal limits  BASIC METABOLIC PANEL - Abnormal; Notable for the following:    Potassium 3.3 (*)    Glucose, Bld 162 (*)    Creatinine, Ser 1.20 (*)    GFR calc non Af Amer 41 (*)    GFR calc Af Amer 48 (*)    All other components within normal limits  URINALYSIS,  ROUTINE W REFLEX MICROSCOPIC - Abnormal; Notable for the following:    Protein, ur 30 (*)    All other components within normal limits  PROTIME-INR - Abnormal; Notable for the following:    Prothrombin Time 18.9 (*)    INR 1.63 (*)    All other components within normal limits  URINE MICROSCOPIC-ADD ON - Abnormal; Notable for the following:    Bacteria, UA FEW (*)    All other components within normal limits  URINE CULTURE  TROPONIN I   Imaging Review Ct Head Wo Contrast  04/11/2013   CLINICAL DATA:  Increased weakness in September. Vomiting this morning. History of hypertension, diabetes, and PVD.  EXAM: CT HEAD WITHOUT CONTRAST  TECHNIQUE: Contiguous axial images were obtained from the base of the skull through the vertex without intravenous contrast.  COMPARISON:  None available  FINDINGS: There is central and cortical atrophy. Periventricular white matter changes are consistent with small vessel disease. There is no evidence for hemorrhage, mass lesion, or acute infarction.  Bone windows show no calvarial fracture. There is atherosclerotic calcification of the internal carotid arteries.  IMPRESSION: 1. Atrophy and small vessel disease. 2.  No evidence for acute intracranial abnormality.   Electronically Signed   By: Rosalie Gums M.D.   On: 04/11/2013 15:26    EKG Interpretation   None       Date: 04/11/2013  Rate: 89  Rhythm: atrial fibrillation  QRS Axis: left  Intervals: QRS prolonged  ST/T Wave abnormalities: nonspecific ST/T changes  Conduction Disutrbances:right bundle branch block and left anterior fascicular block  Narrative Interpretation:   Old EKG Reviewed: unchanged    MDM   1. Vertigo    Pt is a 79 y.o. female with Pmhx as above who presents with sudden onset dizziness described as feeling like she would fall down and couldn't keep her eyes open a 9am this morning, about 30 min after waking.  She  also began having diplopia at this time. Symptoms have continued,  though is improved with laying still.  She denies numbness, weakness, fever, h/a, chills, CP, ab pain, leg pain or swelling. On PE, Pt mildly tachycardic (in afib), but in NAD.  She is holding one eye closed due to ocular symptoms.  She has no CN deficits though has horizontal nystagmus, no focal numbness, or weakness, though is unable to ambulate due to dizziness.  She had recent admissions due to afib and GI bleed while on coumadin, is now of eliquis.  I believe she is high risk for a posterior circulation CVA, will r/o concomittent anemia, e-lyte abnormalities, but feel she will require MRI.   4:41 PM CT head unremarkable. Hb stable, trop not elevated.  Have spoken to neurology & hospitalist, Pt will be [facility], CVA w/u.    Shanna Cisco, MD 04/11/13 [phone]

## 2013-04-12 ENCOUNTER — Observation Stay (HOSPITAL_COMMUNITY): Payer: Medicare Other

## 2013-04-12 DIAGNOSIS — I369 Nonrheumatic tricuspid valve disorder, unspecified: Secondary | ICD-10-CM

## 2013-04-12 DIAGNOSIS — R42 Dizziness and giddiness: Secondary | ICD-10-CM

## 2013-04-12 DIAGNOSIS — E46 Unspecified protein-calorie malnutrition: Secondary | ICD-10-CM | POA: Diagnosis present

## 2013-04-12 DIAGNOSIS — E876 Hypokalemia: Secondary | ICD-10-CM

## 2013-04-12 DIAGNOSIS — E039 Hypothyroidism, unspecified: Secondary | ICD-10-CM | POA: Diagnosis present

## 2013-04-12 LAB — RETICULOCYTES
Retic Count, Absolute: 110.1 10*3/uL (ref 19.0–186.0)
Retic Ct Pct: 3.1 % (ref 0.4–3.1)

## 2013-04-12 LAB — LIPID PANEL
HDL: 46 mg/dL (ref >39)
Total CHOL/HDL Ratio: 2.4 RATIO
VLDL: 10 mg/dL (ref 0–40)

## 2013-04-12 LAB — T4, FREE: Free T4: 1.54 ng/dL (ref 0.80–1.80)

## 2013-04-12 LAB — URINE CULTURE

## 2013-04-12 LAB — ALBUMIN: Albumin: 3.3 g/dL — ABNORMAL LOW (ref 3.5–5.2)

## 2013-04-12 LAB — BILIRUBIN, FRACTIONATED(TOT/DIR/INDIR)
Bilirubin, Direct: 0.3 mg/dL (ref 0.0–0.3)
Indirect Bilirubin: 0.6 mg/dL (ref 0.3–0.9)
Total Bilirubin: 0.9 mg/dL (ref 0.3–1.2)

## 2013-04-12 LAB — LACTATE DEHYDROGENASE: LDH: 306 U/L — ABNORMAL HIGH (ref 94–250)

## 2013-04-12 LAB — PREALBUMIN: Prealbumin: 12.6 mg/dL — ABNORMAL LOW (ref 17.0–34.0)

## 2013-04-12 LAB — TSH: TSH: 1.66 u[IU]/mL (ref 0.350–4.500)

## 2013-04-12 MED ORDER — TRAMADOL HCL 50 MG PO TABS
50.0000 mg | ORAL_TABLET | Freq: Four times a day (QID) | ORAL | Status: DC | PRN
  Administered 2013-04-12: 50 mg via ORAL
  Filled 2013-04-12: qty 1

## 2013-04-12 MED ORDER — AMIODARONE HCL 200 MG PO TABS: 200.0000 mg | ORAL_TABLET | Freq: Every day | ORAL | Status: DC

## 2013-04-12 MED ORDER — POTASSIUM CHLORIDE 10 MEQ/100ML IV SOLN
10.0000 meq | INTRAVENOUS | Status: AC
  Administered 2013-04-12 (×2): 10 meq via INTRAVENOUS
  Filled 2013-04-12 (×3): qty 100

## 2013-04-12 MED ORDER — SODIUM CHLORIDE 0.9 % IV BOLUS (SEPSIS)
500.0000 mL | Freq: Once | INTRAVENOUS | Status: AC
  Administered 2013-04-12: 500 mL via INTRAVENOUS

## 2013-04-12 MED ORDER — AMIODARONE HCL 200 MG PO TABS
200.0000 mg | ORAL_TABLET | Freq: Every day | ORAL | Status: DC
  Administered 2013-04-12 – 2013-04-13 (×2): 200 mg via ORAL
  Filled 2013-04-12 (×2): qty 1

## 2013-04-12 MED ORDER — SIMVASTATIN 40 MG PO TABS
40.0000 mg | ORAL_TABLET | Freq: Every day | ORAL | Status: DC
  Filled 2013-04-12: qty 1

## 2013-04-12 MED ORDER — ATORVASTATIN CALCIUM 20 MG PO TABS
20.0000 mg | ORAL_TABLET | Freq: Every day | ORAL | Status: DC
  Administered 2013-04-12: 20 mg via ORAL
  Filled 2013-04-12 (×3): qty 1

## 2013-04-12 MED ORDER — APIXABAN 2.5 MG PO TABS
2.5000 mg | ORAL_TABLET | Freq: Two times a day (BID) | ORAL | Status: DC
  Administered 2013-04-12 – 2013-04-13 (×3): 2.5 mg via ORAL
  Filled 2013-04-12 (×4): qty 1

## 2013-04-12 NOTE — Evaluation (Signed)
Physical Therapy Evaluation Patient Details Name: Sydney Quinn MRN: [id number] DOB: [birth year] Today's Date: 04/12/2013 Time: 4098-1191 PT Time Calculation (min): 24 min  PT Assessment / Plan / Recommendation History of Present Illness    Sydney Quinn is an 79 y.o.WF PMHx GI bleed, angiodysplasia of stomach, Diastolic heart failure, mitral valve regurgitation, Atrial fibrillation with occasional RVR(S/P failed ablation x2). Presented with sudden onset vertigo on 04/11/2013. She also developed diplopia at that time.  Symptoms have improved greatly, and she would like to go home.    Clinical Impression  Pt presents with no s/s functional mobility deficits this eval.  Recommend dietary consult for poor appetite and return to community based physical activity if ok with cardiologist. No further PT needs.    PT Assessment  Patent does not need any further PT services    Follow Up Recommendations  No PT follow up    Does the patient have the potential to tolerate intense rehabilitation      Barriers to Discharge        Equipment Recommendations  None recommended by PT    Recommendations for Other Services Other (comment) (return to community based physical activity)   Frequency      Precautions / Restrictions     Pertinent Vitals/Pain na      Mobility  Bed Mobility Bed Mobility: Supine to Sit Supine to Sit: 7: Independent Transfers Transfers: Sit to Stand;Stand to Sit Sit to Stand: 7: Independent Stand to Sit: 7: Independent Ambulation/Gait Ambulation/Gait Assistance: 7: Independent Ambulation Distance (Feet): 150 Feet Assistive device: None Gait Pattern: Step-through pattern General Gait Details: able to maintain gait mechanics and independence when challenged by head turns, velocity and direction changes Stairs: No Wheelchair Mobility Wheelchair Mobility: No Modified Rankin (Stroke Patients Only) Pre-Morbid Rankin Score: No symptoms Modified Rankin: No  significant disability    Exercises     PT Diagnosis:    PT Problem List:   PT Treatment Interventions:       PT Goals(Current goals can be found in the care plan section) Acute Rehab PT Goals Patient Stated Goal: do zumba again.  PT Goal Formulation: No goals set, d/c therapy  Visit Information  Last PT Received On: 04/12/13 Assistance Needed: +1       Prior Functioning  Home Living Family/patient expects to be discharged to:: Private residence Living Arrangements: Alone Available Help at Discharge: Family;Available PRN/intermittently Type of Home: House Home Access: Level entry Home Layout: One level Home Equipment: None Additional Comments: great family support Prior Function Level of Independence: Independent Comments: 3 months ago pt was doing Zumba classes, she is highly independent and wants to return to/toward that level.  She wants/need cardiologist ok for physical activity and is advised to begin a light to moderate intensity activity program once cleared medically Communication Communication: No difficulties Dominant Hand: Right    Cognition  Cognition Arousal/Alertness: Awake/alert Behavior During Therapy: WFL for tasks assessed/performed Overall Cognitive Status: Within Functional Limits for tasks assessed    Extremity/Trunk Assessment Upper Extremity Assessment Upper Extremity Assessment: Overall WFL for tasks assessed Lower Extremity Assessment Lower Extremity Assessment: Overall WFL for tasks assessed   Balance Balance Balance Assessed: Yes Standardized Balance Assessment Standardized Balance Assessment: Dynamic Gait Index Dynamic Gait Index Level Surface: Normal Change in Gait Speed: Normal Gait with Horizontal Head Turns: Normal Gait with Vertical Head Turns: Normal Gait and Pivot Turn: Mild Impairment Step Over Obstacle: Normal Step Around Obstacles: Normal Steps: Normal Total Score: 23  End of  Session PT - End of Session Activity  Tolerance: Patient tolerated treatment well Patient left: in chair;with call bell/phone within reach;with family/visitor present Nurse Communication: Mobility status  GP     Dennis Bast 04/12/2013, 3:44 PM

## 2013-04-12 NOTE — Progress Notes (Signed)
Stroke Team Progress Note  HISTORY Sydney Quinn is an 79 y.o. female with a history of atrial fibrillation who presented with sudden onset vertigo on 04/11/2013. She got up in her normal state, but subsequently around 9:30 that morning began having a spinning sensation then became unsteady. She also developed diplopia at that time. She stated that she did not remember clearly whether she was able to walk but stated that she felt that she needed a wheelchair to get into the ED. She stated that the symptoms had improved greatly, but she still had some mild symptoms. She no longer had any diplopia.  LKW: 9:30 AM 04/11/2013 tpa given?: no, outside of window, mild symptoms  NIHSS: 1   SUBJECTIVE The patient's son is present this morning. I had a long talk with him regarding the patient's medical issues and possible assisted living placement. [place] She feels her dizziness and diplopia have resolved.  OBJECTIVE Most recent Vital Signs: Filed Vitals:   04/12/13 0000 04/12/13 0239 04/12/13 0435 04/12/13 0634  BP: 126/85 96/63 114/74 138/84  Pulse: 102 99 109 111  Temp: 97.9 F (36.6 C) 98 F (36.7 C) 98.1 F (36.7 C) 98.1 F (36.7 C)  TempSrc: Oral Oral Oral Oral  Resp: 18 18 20 20   Height:      Weight:      SpO2: 93% 94% 92% 94%   CBG (last 3)  No results found for this basename: GLUCAP,  in the last 72 hours  IV Fluid Intake:     MEDICATIONS  . ondansetron (ZOFRAN) IV  4 mg Intravenous Once   PRN:  senna-docusate  Diet:  Carb Control thin liquids Activity:  Bedrest DVT Prophylaxis:  None - will order SCDs     ( Was on Eliquis PTA for afib)  CLINICALLY SIGNIFICANT STUDIES Basic Metabolic Panel:   Recent Labs Lab 04/11/13 1405  NA 141  K 3.3*  CL 100  CO2 28  GLUCOSE 162*  BUN 17  CREATININE 1.20*  CALCIUM 9.6   Liver Function Tests: No results found for this basename: AST, ALT, ALKPHOS, BILITOT, PROT, ALBUMIN,  in the last  168 hours CBC:   Recent Labs Lab 04/11/13 1405  WBC 6.8  NEUTROABS 5.8  HGB 9.6*  HCT 30.8*  MCV 91.9  PLT 232   Coagulation:   Recent Labs Lab 04/11/13 1405  LABPROT 18.9*  INR 1.63*   Cardiac Enzymes:   Recent Labs Lab 04/11/13 1405  TROPONINI <0.30   Urinalysis:   Recent Labs Lab 04/11/13 1538  COLORURINE YELLOW  LABSPEC 1.010  PHURINE 8.0  GLUCOSEU NEGATIVE  HGBUR NEGATIVE  BILIRUBINUR NEGATIVE  KETONESUR NEGATIVE  PROTEINUR 30*  UROBILINOGEN 0.2  NITRITE NEGATIVE  LEUKOCYTESUR NEGATIVE   Lipid Panel    Component Value Date/Time   CHOL 110 04/12/2013 0525   TRIG 50 04/12/2013 0525   HDL 46 04/12/2013 0525   CHOLHDL 2.4 04/12/2013 0525   VLDL 10 04/12/2013 0525   LDLCALC 54 04/12/2013 0525   HgbA1C  No results found for this basename: HGBA1C    Urine Drug Screen:   No results found for this basename: labopia,  cocainscrnur,  labbenz,  amphetmu,  thcu,  labbarb    Alcohol Level: No results found for this basename: ETH,  in the last 168 hours  Dg Chest 2 View 04/11/2013    Vascular congestion and mild cardiomegaly. Mild right-sided and left basilar airspace opacities may reflect pneumonia or  mild pulmonary edema.      Ct Head Wo Contrast 04/11/2013    1. Atrophy and small vessel disease. 2.  No evidence for acute intracranial abnormality.      MRI of the brain   04/12/2013 Sub cm acute infarction within the left cerebellum. Old small vessel cerebellar infarctions. Chronic small vessel disease throughout the  cerebral hemispheric white matter.   MRA of the brain  04/12/2013 Abnormal flow within the left vertebral artery. This could be due to reduced antegrade flow or retrograde flow.   2D Echocardiogram  pending  Carotid Doppler  pending  EKG atrial fibrillation - ventricular response 89 beats per minute   Therapy Recommendations pending  Physical Exam   General - Pleasant alert 79 yo female in nad. Heart - Irregularly  irregular  Lungs - Clear to auscultation Abdomen - Soft - non tender Extremities - Distal pulses intact - no edema Skin - Warm and dry  Mental Status: Alert, oriented, thought content appropriate.  Speech fluent without evidence of aphasia.  Able to follow 3 step commands without difficulty. Cranial Nerves: II: Discs not visualized; Visual fields grossly normal, pupils equal, round, reactive to light and accommodation III,IV, VI: ptosis not present, extra-ocular motions intact bilaterally V,VII: smile symmetric, facial light touch sensation normal bilaterally VIII: hearing normal bilaterally IX,X: gag reflex present XI: bilateral shoulder shrug XII: midline tongue extension Motor: Right : Upper extremity   5/5    Left:     Upper extremity   5/5  Lower extremity   5/5     Lower extremity   5/5 Tone and bulk:normal tone throughout; no atrophy noted Sensory: Pinprick and light touch intact throughout, bilaterally Deep Tendon Reflexes: 2+ and symmetric throughout Plantars: Right: downgoing   Left: downgoing Cerebellar: normal finger-to-nose Gait: deferred. CV: pulses palpable throughout     ASSESSMENT Ms. Sydney Quinn is a 79 y.o. female presenting with vertigo and diplopia. TPA was not given secondary to mild symptoms and uncertain diagnosis. A CT scan revealed atrophy but no acute changes. An MRI and MRA are pending. A small posterior circulation stroke suspected most likely embolic secondary to atrial fibrillation.  On Eliquis prior to admission. Now on no antithrombotics for secondary stroke prevention. Patient with resultant resolution of deficits . Work up underway.   Atrial fibrillation on Eliquis prior to admission. With occasional rapid ventricular response. Currently off amiodarone and Toprol.  Hypertension history  Diabetes mellitus - hemoglobin A1c pending  Hyperlipidemia - Zocor prior to admission - cholesterol 110 LDL 54  History of coronary artery  disease  Hypokalemia - K 3.3  Anemia - HGB 9.6  Renal insufficiency - creat. 1.20  Hospital day # 1  TREATMENT/PLAN  Will resume Eliquis based on MRI / MRA results.  Await Echo, HGBA1C, Carotid dopplers  Await therapy evaluations.  We will request case manager to meet with the patient's son to discuss possible assisted living placement per his request.  Delton See PA-C [facility] Pager [phone] 04/12/2013, 10:05 AM  I have personally obtained a history, examined the patient, evaluated imaging results, and formulated the assessment and plan of care. I agree with the above.  Elspeth Cho, DO Neurology-Stroke

## 2013-04-12 NOTE — Progress Notes (Signed)
Utilization Review completed.  

## 2013-04-12 NOTE — Progress Notes (Signed)
[facility] PROGRESS NOTE  [name] WUJ:[id number] DOB: Oct 12, 1929 DOA: 04/11/2013 PCP: Verlon Au, MD  Assessment/Plan:  CVA -New onset CVA see MRI results below. Consult PT/OT -Patient's pasted bedside swallow evaluation without restriction  -Per neurology recommendations restart Eliquis 2.5 mg BID  Vertigo-  -MRI/MRA of Brain; see results below - 2-D ECHO and Carotid Doppler results pending - Check Orthostatics daily (negative orthostatic hypotension 11/16) -Continue Fall Precautions.   Hypotension - BP still somewhat soft will therefore hold metoprolol XL 25 mg daily  -Continue Gentle Rehydration.   Atrial Fibrillation - On Amiodarone, Toprol, and Eliquis Rx.  -Currently patient not rate controlled will restart amiodarone 200 mg daily  Diastolic CHF - Chronic, Monitor for S/Sx of Fluid Overload.  -cardiogram results pending  Hyperlipidemia - Continue simvastatin 40 mg daily -Within ADA guidelines   Normocytic Anemia Anemia Panel, pending.   Hypokalemia -Will replete; monitor daily  Hypothyroidism -Obtain TSH/free T4 -Continue Synthroid daily  Malnutrition -Obtain nutrition consult -Obtain albumin/prealbumin level     Code Status: Full Family Communication: Family present for discussion of plan of care Disposition Plan:    Consultants: Neurology (Dr. Elspeth Cho)   Procedures: Echocardiogram; pending  By Doppler 04/12/2013 There is significant plaque noted in the bilateral CCAs, ICAs, and ECAs. 1-39% ICA stenosis. Right vertebral artery flow is antegrade. Left vertebral artery flow is retrograde.   MRI/MRI brain without contrast 04/12/2013 MRA HEAD FINDINGS  Abnormal flow within the left vertebral artery. This could be due to  reduced antegrade flow or retrograde flow.   IMPRESSION:  -Sub cm acute infarction within the left cerebellum.  -Old small vessel cerebellar infarctions. - Chronic small vessel disease  throughout the cerebral hemispheric white matter.     Antibiotics:    HPI/Subjective: Sydney Quinn is an 79 y.o.WF PMHx  GI bleed, angiodysplasia of stomach, Diastolic heart failure, mitral valve regurgitation, Atrial fibrillation with occasional RVR(S/P failed ablation x2). Presented with sudden onset vertigo on 04/11/2013. She got up in her normal state, but subsequently around 9:30 that morning began having a spinning sensation then became unsteady. She also developed diplopia at that time. She stated that she did not remember clearly whether she was able to walk but stated that she felt that she needed a wheelchair to get into the ED. She stated that the symptoms had improved greatly, but she still had some mild symptoms. She no longer had any diplopia. TODAY states would like to go home. Negative vertigo, negative N./V., negative CP, negative SOB. Family present and states concern that patient has some malnourishment secondary to poor appetite, increasing fatigue and weight loss of 20 pounds in last 3 years patient    Objective: Filed Vitals:   04/12/13 0239 04/12/13 0435 04/12/13 0634 04/12/13 1013  BP: 96/63 114/74 138/84 124/84  Pulse: 99 109 111 100  Temp: 98 F (36.7 C) 98.1 F (36.7 C) 98.1 F (36.7 C) 97.8 F (36.6 C)  TempSrc: Oral Oral Oral Oral  Resp: 18 20 20 18   Height:      Weight:      SpO2: 94% 92% 94% 92%   No intake or output data in the 24 hours ending 04/12/13 1119 Filed Weights   04/11/13 2104  Weight: 48.1 kg (106 lb 0.7 oz)    Exam:   General: A./O. x4, NAD  Cardiovascular: Irregular irregular rhythm and rate, no murmurs rubs or gallops, DP/PT 1+ bilateral  Respiratory: Clear to auscultation bilateral  Abdomen: Soft, nontender, nondistended,  plus bowel  Musculoskeletal: Negative pedal edema, cachectic  Neurologic; pupils equal round reactive to light and accommodation, cranial nerves II through XII intact, tongue/uvula midline, touch within  normal limits, finger to nose to finger within normal limits bilateral, all extremity muscle strength 5/5, sensation intact throughout, did not and that patient however patient was walking out of the restroom when a intradural and showed no gait abnormality  Data Reviewed: Basic Metabolic Panel:  Recent Labs Lab 04/11/13 1405  NA 141  K 3.3*  CL 100  CO2 28  GLUCOSE 162*  BUN 17  CREATININE 1.20*  CALCIUM 9.6   Liver Function Tests: No results found for this basename: AST, ALT, ALKPHOS, BILITOT, PROT, ALBUMIN,  in the last 168 hours No results found for this basename: LIPASE, AMYLASE,  in the last 168 hours No results found for this basename: AMMONIA,  in the last 168 hours CBC:  Recent Labs Lab 04/11/13 1405  WBC 6.8  NEUTROABS 5.8  HGB 9.6*  HCT 30.8*  MCV 91.9  PLT 232   Cardiac Enzymes:  Recent Labs Lab 04/11/13 1405  TROPONINI <0.30   BNP (last 3 results) No results found for this basename: PROBNP,  in the last 8760 hours CBG: No results found for this basename: GLUCAP,  in the last 168 hours  No results found for this or any previous visit (from the past 240 hour(s)).   Studies: Dg Chest 2 View  04/11/2013   CLINICAL DATA:  CVA.  EXAM: CHEST  2 VIEW  COMPARISON:  Chest radiograph performed 02/04/2013, and CTA of the chest from 02/05/2013  FINDINGS: The lungs are well expanded. Vascular congestion is noted. Mild right-sided and left basilar airspace opacities may reflect pneumonia or mild pulmonary edema. No definite pleural effusion or pneumothorax is seen.  The cardiomediastinal silhouette is mildly enlarged. No acute osseous abnormalities are identified.  IMPRESSION: Vascular congestion and mild cardiomegaly. Mild right-sided and left basilar airspace opacities may reflect pneumonia or mild pulmonary edema.   Electronically Signed   By: Roanna Raider M.D.   On: 04/11/2013 23:57   Ct Head Wo Contrast  04/11/2013   CLINICAL DATA:  Increased weakness in  September. Vomiting this morning. History of hypertension, diabetes, and PVD.  EXAM: CT HEAD WITHOUT CONTRAST  TECHNIQUE: Contiguous axial images were obtained from the base of the skull through the vertex without intravenous contrast.  COMPARISON:  None available  FINDINGS: There is central and cortical atrophy. Periventricular white matter changes are consistent with small vessel disease. There is no evidence for hemorrhage, mass lesion, or acute infarction.  Bone windows show no calvarial fracture. There is atherosclerotic calcification of the internal carotid arteries.  IMPRESSION: 1. Atrophy and small vessel disease. 2.  No evidence for acute intracranial abnormality.   Electronically Signed   By: Rosalie Gums M.D.   On: 04/11/2013 15:26   Mr Brain Wo Contrast  04/12/2013   CLINICAL DATA:  Sudden onset of dizziness and visual disturbance.  EXAM: MRI HEAD WITHOUT CONTRAST  MRA HEAD WITHOUT CONTRAST  TECHNIQUE: Multiplanar, multiecho pulse sequences of the brain and surrounding structures were obtained without intravenous contrast. Angiographic images of the head were obtained using MRA technique without contrast.  COMPARISON:  Head CT 04/11/2013.  MRI 10/14/2008.  FINDINGS: MRI HEAD FINDINGS  Diffusion imaging shows a 4 mm acute infarction within the central left cerebellum. No other acute infarction. There are mild chronic small-vessel changes affecting the pons. There are a few old small  vessel cerebellar infarctions. The cerebral hemispheres show age related atrophy with chronic small-vessel changes affecting the hemispheric white matter. No cortical or large vessel territory infarction. No mass lesion, hemorrhage, hydrocephalus or extra-axial collection. No pituitary abnormality. Sinuses are clear.  MRA HEAD FINDINGS  Both internal carotid arteries are widely patent into the brain. The anterior and middle cerebral vessels are patent without proximal stenosis, aneurysm or vascular malformation.  The  dominant right vertebral artery shows normal flow to the basilar. Flow is demonstrated in the left vertebral artery, but this flow does not appear normal. This could be reduced or retrograde. No basilar stenosis. Posterior circulation branch vessels are patent. Large posterior communicating artery on the left.  IMPRESSION: Sub cm acute infarction within the left cerebellum. Old small vessel cerebellar infarctions. Chronic small vessel disease throughout the cerebral hemispheric white matter.  Abnormal flow within the left vertebral artery. This could be due to reduced antegrade flow or retrograde flow.   Electronically Signed   By: Paulina Fusi M.D.   On: 04/12/2013 09:52   Mr Maxine Glenn Head/brain Wo Cm  04/12/2013   CLINICAL DATA:  Sudden onset of dizziness and visual disturbance.  EXAM: MRI HEAD WITHOUT CONTRAST  MRA HEAD WITHOUT CONTRAST  TECHNIQUE: Multiplanar, multiecho pulse sequences of the brain and surrounding structures were obtained without intravenous contrast. Angiographic images of the head were obtained using MRA technique without contrast.  COMPARISON:  Head CT 04/11/2013.  MRI 10/14/2008.  FINDINGS: MRI HEAD FINDINGS  Diffusion imaging shows a 4 mm acute infarction within the central left cerebellum. No other acute infarction. There are mild chronic small-vessel changes affecting the pons. There are a few old small vessel cerebellar infarctions. The cerebral hemispheres show age related atrophy with chronic small-vessel changes affecting the hemispheric white matter. No cortical or large vessel territory infarction. No mass lesion, hemorrhage, hydrocephalus or extra-axial collection. No pituitary abnormality. Sinuses are clear.  MRA HEAD FINDINGS  Both internal carotid arteries are widely patent into the brain. The anterior and middle cerebral vessels are patent without proximal stenosis, aneurysm or vascular malformation.  The dominant right vertebral artery shows normal flow to the basilar. Flow is  demonstrated in the left vertebral artery, but this flow does not appear normal. This could be reduced or retrograde. No basilar stenosis. Posterior circulation branch vessels are patent. Large posterior communicating artery on the left.  IMPRESSION: Sub cm acute infarction within the left cerebellum. Old small vessel cerebellar infarctions. Chronic small vessel disease throughout the cerebral hemispheric white matter.  Abnormal flow within the left vertebral artery. This could be due to reduced antegrade flow or retrograde flow.   Electronically Signed   By: Paulina Fusi M.D.   On: 04/12/2013 09:52    Scheduled Meds: . apixaban  2.5 mg Oral BID  . ondansetron (ZOFRAN) IV  4 mg Intravenous Once   Continuous Infusions:   Principal Problem:   Vertigo Active Problems:   A-fib   Diastolic CHF   Other and unspecified hyperlipidemia   Normocytic anemia   Hypotension, unspecified    Time spent: 40 min    Rowynn Mcweeney, J  [facility] Pager [phone]. If 7PM-7AM, please contact night-coverage at [email or web address], password [facility] 04/12/2013, 11:19 AM  LOS: 1 day

## 2013-04-12 NOTE — Progress Notes (Signed)
VASCULAR LAB PRELIMINARY  PRELIMINARY  PRELIMINARY  PRELIMINARY  Carotid Dopplers completed.    Preliminary report:  There is significant plaque noted in the bilateral CCAs, ICAs, and ECAs.  1-39% ICA stenosis.  Right vertebral artery flow is antegrade. Left vertebral artery flow is retrograde.  Genelle Economou, RVT 04/12/2013, 11:06 AM

## 2013-04-12 NOTE — Progress Notes (Signed)
Echo Lab  2D Echocardiogram completed.  Souleymane Saiki L Bill Mcvey, RDCS 04/12/2013 10:40 AM

## 2013-04-13 ENCOUNTER — Encounter (HOSPITAL_COMMUNITY): Payer: Self-pay | Admitting: Nurse Practitioner

## 2013-04-13 DIAGNOSIS — E7800 Pure hypercholesterolemia, unspecified: Secondary | ICD-10-CM

## 2013-04-13 DIAGNOSIS — I639 Cerebral infarction, unspecified: Secondary | ICD-10-CM

## 2013-04-13 DIAGNOSIS — E039 Hypothyroidism, unspecified: Secondary | ICD-10-CM

## 2013-04-13 DIAGNOSIS — D62 Acute posthemorrhagic anemia: Secondary | ICD-10-CM

## 2013-04-13 DIAGNOSIS — H532 Diplopia: Secondary | ICD-10-CM | POA: Diagnosis present

## 2013-04-13 DIAGNOSIS — N289 Disorder of kidney and ureter, unspecified: Secondary | ICD-10-CM

## 2013-04-13 DIAGNOSIS — I5031 Acute diastolic (congestive) heart failure: Secondary | ICD-10-CM

## 2013-04-13 HISTORY — DX: Cerebral infarction, unspecified: I63.9

## 2013-04-13 LAB — COMPREHENSIVE METABOLIC PANEL
ALT: 24 U/L (ref 0–35)
AST: 22 U/L (ref 0–37)
Albumin: 2.9 g/dL — ABNORMAL LOW (ref 3.5–5.2)
Alkaline Phosphatase: 85 U/L (ref 39–117)
Calcium: 8.8 mg/dL (ref 8.4–10.5)
GFR calc Af Amer: 51 mL/min — ABNORMAL LOW (ref >90)
Glucose, Bld: 108 mg/dL — ABNORMAL HIGH (ref 70–99)
Potassium: 3.3 mEq/L — ABNORMAL LOW (ref 3.5–5.1)
Sodium: 143 mEq/L (ref 135–145)
Total Protein: 6.7 g/dL (ref 6.0–8.3)

## 2013-04-13 LAB — CBC WITH DIFFERENTIAL/PLATELET
Basophils Absolute: 0 10*3/uL (ref 0.0–0.1)
Basophils Relative: 0 % (ref 0–1)
Eosinophils Relative: 1 % (ref 0–5)
HCT: 29.6 % — ABNORMAL LOW (ref 36.0–46.0)
Hemoglobin: 9.6 g/dL — ABNORMAL LOW (ref 12.0–15.0)
MCH: 29 pg (ref 26.0–34.0)
MCHC: 32.4 g/dL (ref 30.0–36.0)
MCV: 89.4 fL (ref 78.0–100.0)
Monocytes Absolute: 0.6 10*3/uL (ref 0.1–1.0)
Monocytes Relative: 8 % (ref 3–12)
Neutro Abs: 5.8 10*3/uL (ref 1.7–7.7)
RBC: 3.31 MIL/uL — ABNORMAL LOW (ref 3.87–5.11)
RDW: 16.5 % — ABNORMAL HIGH (ref 11.5–15.5)

## 2013-04-13 LAB — MAGNESIUM: Magnesium: 1.9 mg/dL (ref 1.5–2.5)

## 2013-04-13 MED ORDER — ONDANSETRON HCL 4 MG/2ML IJ SOLN
4.0000 mg | Freq: Four times a day (QID) | INTRAMUSCULAR | Status: DC | PRN
  Administered 2013-04-13: 4 mg via INTRAVENOUS
  Filled 2013-04-13: qty 2

## 2013-04-13 MED ORDER — METOPROLOL SUCCINATE 12.5 MG HALF TABLET: 12.5000 mg | ORAL_TABLET | Freq: Every day | ORAL | Status: DC

## 2013-04-13 MED ORDER — ATORVASTATIN CALCIUM 20 MG PO TABS: 20.0000 mg | ORAL_TABLET | Freq: Every day | ORAL | Status: DC

## 2013-04-13 MED ORDER — POTASSIUM CHLORIDE CRYS ER 20 MEQ PO TBCR: 40.0000 meq | EXTENDED_RELEASE_TABLET | Freq: Once | ORAL | Status: DC

## 2013-04-13 NOTE — Evaluation (Signed)
Occupational Therapy Evaluation Patient Details Name: Sydney Quinn MRN: [id number] DOB: 1930-05-28 Today's Date: 04/13/2013 Time: 4696-2952 OT Time Calculation (min): 29 min  OT Assessment / Plan / Recommendation History of present illness 79 year old female with history of hypertension, CAD, atrial fibrillation s/p cardioversion on 02/04/2013, was discharged on 02/06/13 from [facility] on amiodarone and Coumadin. Patient presented back to ER with increasing generalized weakness, fatigue and "low energy". Patient lives at home by herself. Patient was accompanied by her son. Patient reports that 3 days after she was discharged, she had dark black melanotic stool but she did not pay much attention at that time. She usually has constipation. However in the last 2-3 days, she's been having poor appetite, increasing generalized weakness, fatigue and dyspnea on exertion.   Clinical Impression   PT admitted with MRI (+) Lt cerebellum CVA. Pt currently with functional limitiations due to the deficits listed below (see OT problem list).  Pt will benefit from skilled OT to increase their independence and safety with adls and balance to allow discharge outpatient therapy. OT to follow acutely to work on balance with ADLs     OT Assessment  Patient needs continued OT Services    Follow Up Recommendations  Outpatient OT;Supervision - Intermittent (family to assist due to fall risk)    Barriers to Discharge      Equipment Recommendations  None recommended by OT    Recommendations for Other Services    Frequency  Min 2X/week    Precautions / Restrictions Precautions Precautions: Fall   Pertinent Vitals/Pain none    ADL  Eating/Feeding: Independent Where Assessed - Eating/Feeding: Bed level Grooming: Wash/dry hands;Teeth care;Supervision/safety Where Assessed - Grooming: Unsupported standing Toilet Transfer: Radiographer, therapeutic Method: Sit to Barista: Regular  height toilet Transfers/Ambulation Related to ADLs: pt ambulating to door way and turning head right with LOB posteriorly. pt with lOB turning in hallway. Pt reports feeling "frustrated" with herself due to balance deficits. Pt holding onto walls for support. Pt declines use of RW or cane ADL Comments: Pt educated by SLP on "FAST" for stroke. Pt recalled two out 4 to educate son. Son educated nad provided stroke handout. Pt called sisters to come sit with her instead of calling son or EMS when symptoms started. Pt and son educated on the importance of teaching all caregivers to help prevent future events. Pt and family advise to spend the first few days upon d/c together due to unsteady balance. OT recommend further balance workup at outpatient.    OT Diagnosis: Generalized weakness  OT Problem List: Decreased strength;Decreased activity tolerance;Impaired balance (sitting and/or standing);Impaired vision/perception OT Treatment Interventions: Self-care/ADL training;Therapeutic exercise;DME and/or AE instruction;Therapeutic activities;Patient/family education;Balance training   OT Goals(Current goals can be found in the care plan section) Acute Rehab OT Goals Patient Stated Goal: do zumba again.  OT Goal Formulation: With patient/family Time For Goal Achievement: 04/27/13 Potential to Achieve Goals: Good  Visit Information  Last OT Received On: 04/13/13 Assistance Needed: +1 History of Present Illness: 79 year old female with history of hypertension, CAD, atrial fibrillation s/p cardioversion on 02/04/2013, was discharged on 02/06/13 from [facility] on amiodarone and Coumadin. Patient presented back to ER with increasing generalized weakness, fatigue and "low energy". Patient lives at home by herself. Patient was accompanied by her son. Patient reports that 3 days after she was discharged, she had dark black melanotic stool but she did not pay much attention at that time. She usually has constipation.  However in the  last 2-3 days, she's been having poor appetite, increasing generalized weakness, fatigue and dyspnea on exertion.       Prior Functioning     Home Living Family/patient expects to be discharged to:: Private residence Living Arrangements: Alone Available Help at Discharge: Family;Available PRN/intermittently Type of Home: House Home Access: Level entry Home Layout: One level Home Equipment: None Additional Comments: great family support Prior Function Level of Independence: Independent Comments: pt still drives and lives alone. pt has support of son and can go stay at son's house if needed Communication Communication: No difficulties Dominant Hand: Right         Vision/Perception Vision - History Baseline Vision: No visual deficits Patient Visual Report: Other (comment) (rotational nystagmus) Vision - Assessment Vision Assessment: Vision tested Ocular Range of Motion: Within Functional Limits Tracking/Visual Pursuits: Able to track stimulus in all quads without difficulty Additional Comments: Pt demonstrates Rt eye rototional nystamgus    Cognition  Cognition Arousal/Alertness: Awake/alert Behavior During Therapy: WFL for tasks assessed/performed Overall Cognitive Status: Within Functional Limits for tasks assessed    Extremity/Trunk Assessment Upper Extremity Assessment Upper Extremity Assessment: Overall WFL for tasks assessed Lower Extremity Assessment Lower Extremity Assessment: Defer to PT evaluation Cervical / Trunk Assessment Cervical / Trunk Assessment: Normal     Mobility Bed Mobility Bed Mobility: Supine to Sit;Sit to Supine;Sitting - Scoot to Edge of Bed Supine to Sit: 7: Independent Sitting - Scoot to [place] of Bed: 6: Modified independent (Device/Increase time) Sit to Supine: 6: Modified independent (Device/Increase time) Transfers Transfers: Sit to Stand;Stand to Sit Sit to Stand: 5: Supervision Stand to Sit: 5: Supervision      Exercise     Balance High Level Balance High Level Balance Activites: Turns;Head turns High Level Balance Comments: pt with LOB this session   End of Session OT - End of Session Activity Tolerance: Patient tolerated treatment well Patient left: in bed;with call bell/phone within reach;with family/visitor present Nurse Communication: Mobility status;Precautions  GO     Harolyn Rutherford 04/13/2013, 9:36 AM Pager: [phone]

## 2013-04-13 NOTE — Care Management Note (Unsigned)
    Page 1 of 1   04/13/2013     2:22:33 PM   CARE MANAGEMENT NOTE 04/13/2013  Patient:  Sydney, Quinn   Account Number:  [id number]  Date Initiated:  04/12/2013  Documentation initiated by:  Lanier Clam  Subjective/Objective Assessment:   79 Y/O F ADMITTED W/WEAKNESS.+STROKE.     Action/Plan:   FROM HOME,ALONE.   Anticipated DC Date:  04/14/2013   Anticipated DC Plan:  HOME W HOME HEALTH SERVICES         Choice offered to / List presented to:             Status of service:  Completed, signed off Medicare Important Message given?   (If response is "NO", the following Medicare IM given date fields will be blank) Date Medicare IM given:   Date Additional Medicare IM given:    Discharge Disposition:    Per UR Regulation:  Reviewed for med. necessity/level of care/duration of stay  If discussed at Long Length of Stay Meetings, dates discussed:    Comments:  04/13/13 1330 Elmer Bales RN, MSN, CM- Met with patient and son regarding discharge needs. Son was requesting information on ALF or short term options at discharge.  CSW was notified.  Patient and son were also provided with a privated duty care list should the patient decide to stay in her home.  Son plans to take patient home with him short-term after discharge and pursue other avenues if the need arises.

## 2013-04-13 NOTE — Progress Notes (Signed)
Stroke Team Progress Note  HISTORY Sydney Quinn is an 79 y.o. female with a history of atrial fibrillation who presented with sudden onset vertigo on 04/11/2013. She got up in her normal state, but subsequently around 9:30 that morning began having a spinning sensation then became unsteady. She also developed diplopia at that time. She stated that she did not remember clearly whether she was able to walk but stated that she felt that she needed a wheelchair to get into the ED. She stated that the symptoms had improved greatly, but she still had some mild symptoms. She no longer had any diplopia.  LKW: 9:30 AM 04/11/2013 tpa given?: no, outside of window, mild symptoms  NIHSS: 1  SUBJECTIVE Her son is at the bedside. Pt sitting on the edge of the bed.  OBJECTIVE Most recent Vital Signs: Filed Vitals:   04/12/13 1750 04/12/13 2137 04/13/13 0109 04/13/13 0501  BP: 110/56 123/90 106/85 115/82  Pulse: 106 98 82 112  Temp: 98 F (36.7 C) 97.9 F (36.6 C) 98.4 F (36.9 C) 98 F (36.7 C)  TempSrc: Oral Oral Oral Oral  Resp: 18 18 18 18   Height:      Weight:      SpO2:  97% 94% 94%   CBG (last 3)  No results found for this basename: GLUCAP,  in the last 72 hours  IV Fluid Intake:     MEDICATIONS  . amiodarone  200 mg Oral Daily  . apixaban  2.5 mg Oral BID  . atorvastatin  20 mg Oral q1800  . ondansetron (ZOFRAN) IV  4 mg Intravenous Once   PRN:  ondansetron, senna-docusate, traMADol  Diet:  Cardiac thin liquids Activity:  Bedrest DVT Prophylaxis:  SCDs, apixaban  CLINICALLY SIGNIFICANT STUDIES Basic Metabolic Panel:   Recent Labs Lab 04/11/13 1405 04/13/13 0505  NA 141 143  K 3.3* 3.3*  CL 100 105  CO2 28 28  GLUCOSE 162* 108*  BUN 17 12  CREATININE 1.20* 1.13*  CALCIUM 9.6 8.8  MG  --  1.9   Liver Function Tests:   Recent Labs Lab 04/12/13 1330 04/13/13 0505  AST  --  22  ALT  --  24  ALKPHOS  --  85  BILITOT 0.9 0.8  PROT  --  6.7  ALBUMIN 3.3* 2.9*    CBC:   Recent Labs Lab 04/11/13 1405 04/13/13 0505  WBC 6.8 7.8  NEUTROABS 5.8 5.8  HGB 9.6* 9.6*  HCT 30.8* 29.6*  MCV 91.9 89.4  PLT 232 246   Coagulation:   Recent Labs Lab 04/11/13 1405  LABPROT 18.9*  INR 1.63*   Cardiac Enzymes:   Recent Labs Lab 04/11/13 1405  TROPONINI <0.30   Urinalysis:   Recent Labs Lab 04/11/13 1538  COLORURINE YELLOW  LABSPEC 1.010  PHURINE 8.0  GLUCOSEU NEGATIVE  HGBUR NEGATIVE  BILIRUBINUR NEGATIVE  KETONESUR NEGATIVE  PROTEINUR 30*  UROBILINOGEN 0.2  NITRITE NEGATIVE  LEUKOCYTESUR NEGATIVE   Lipid Panel    Component Value Date/Time   CHOL 110 04/12/2013 0525   TRIG 50 04/12/2013 0525   HDL 46 04/12/2013 0525   CHOLHDL 2.4 04/12/2013 0525   VLDL 10 04/12/2013 0525   LDLCALC 54 04/12/2013 0525   HgbA1C  Lab Results  Component Value Date   HGBA1C 5.7* 04/12/2013    Urine Drug Screen:   No results found for this basename: labopia,  cocainscrnur,  labbenz,  amphetmu,  thcu,  labbarb    Alcohol Level: No  results found for this basename: ETH,  in the last 168 hours   CT Head  04/11/2013   1. Atrophy and small vessel disease. 2.  No evidence for acute intracranial abnormality.     MRI of the brain  04/12/2013  Sub cm acute infarction within the left cerebellum. Old small vessel cerebellar infarctions. Chronic small vessel disease throughout the  cerebral hemispheric white matter.  MRA of the brain  04/12/2013 Abnormal flow within the left vertebral artery. This could be due to reduced antegrade flow or retrograde flow.  2D Echocardiogram  EF 45-50%. Pt in atrial fibrillation   Carotid Doppler  There is significant plaque noted in the bilateral CCAs, ICAs, and ECAs. 1-39% ICA stenosis. Right vertebral artery flow is antegrade. Left vertebral artery flow is retrograde.  EKG atrial fibrillation - ventricular response 89 beats per minute  Dg Chest 2 View  04/11/2013   Vascular congestion and mild cardiomegaly.  Mild right-sided and left basilar airspace opacities may reflect pneumonia or mild pulmonary edema.    Therapy Recommendations  OP OT, no PT needs  Physical Exam   General - Pleasant alert 79 yo female in nad. Heart - Irregularly irregular  Lungs - Clear to auscultation Abdomen - Soft - non tender Extremities - Distal pulses intact - no edema Skin - Warm and dry  Mental Status: Alert, oriented, thought content appropriate.  Speech fluent without evidence of aphasia.  Able to follow 3 step commands without difficulty. Cranial Nerves: II: Discs not visualized; Visual fields grossly normal, pupils equal, round, reactive to light and accommodation III,IV, VI: ptosis not present, extra-ocular motions intact bilaterally V,VII: smile symmetric, facial light touch sensation normal bilaterally VIII: hearing normal bilaterally IX,X: gag reflex present XI: bilateral shoulder shrug XII: midline tongue extension Motor: Right : Upper extremity   5/5    Left:     Upper extremity   5/5  Lower extremity   5/5     Lower extremity   5/5 Tone and bulk:normal tone throughout; no atrophy noted Sensory: Pinprick and light touch intact throughout, bilaterally Deep Tendon Reflexes: 2+ and symmetric throughout Plantars: Right: downgoing   Left: downgoing Cerebellar: normal finger-to-nose Gait: deferred. CV: pulses palpable throughout     ASSESSMENT Ms. Sydney Quinn is a 79 y.o. female presenting with vertigo and diplopia. TPA was not given secondary to mild symptoms and uncertain diagnosis.  MRI confirmed a subacute left cerebellar infarct adjacent to her previous old infarct.  suspected most likely embolic secondary to known atrial fibrillation.  On Eliquis 2.5 bid prior to admission. Now on Eliquis bid for secondary stroke prevention. Patient with resultant resolution of deficits. Work up completed.   Atrial fibrillation on Eliquis prior to admission. With occasional rapid ventricular response.  Rated now controlled, on amiodarone  Hypertension history  Diabetes mellitus - hemoglobin A1c 5.6  Hyperlipidemia - Zocor prior to admission - cholesterol 110 LDL 54  History of coronary artery disease  Hypokalemia - K 3.3  Anemia - HGB 9.6  Renal insufficiency - creat. 1.13  Hospital day # 2  TREATMENT/PLAN  Continue Eliquis 2.5 mg bid for secondary stroke prevention. Long discussion with the patient about lack of evidence suggesting switching to Xarelto or Pradaxa may be superior or not. She was advised to discuss this further with her cardiologist Dr. Jens Som at upcoming visit this Wednesday.  OP OT  Continue zocor at discharge Patient has a 10-15% risk of having another stroke over the next year, the highest  risk is within 2 weeks of the most recent stroke/TIA (risk of having a stroke following a stroke or TIA is the same). Ongoing risk factor control by Primary Care Physician Stroke Service will sign off. Please call should any needs arise. Follow up with Dr. Pearlean Brownie, Stroke Clinic, in 2 months.  Dr. Pearlean Brownie discussed diagnosis, prognosis,  treatment options and plan of care with pt and son. Jasmine December discussed safety in returning home, other options. CM consult in place from the weekend. They just want to know their options. Son will address with RN if he does not hear from a CM by noon.      Annie Main, MSN, RN, ANVP-BC, ANP-BC, [facility] Pager: [phone] 04/13/2013 10:20 AM  I have personally obtained a history, examined the patient, evaluated imaging results, and formulated the assessment and plan of care. I agree with the above. Delia Heady, MD

## 2013-04-13 NOTE — Evaluation (Signed)
Speech Language Pathology Evaluation Patient Details Name: Sydney Quinn MRN: [id number] DOB: Apr 16, 1930 Today's Date: 04/13/2013 Time: 7829-5621 SLP Time Calculation (min): 27 min  Problem List:  Patient Active Problem List   Diagnosis Date Noted  . Unspecified protein-calorie malnutrition 04/12/2013  . Unspecified hypothyroidism 04/12/2013  . Dizziness and giddiness 04/11/2013  . Vertigo 04/11/2013  . Diastolic CHF 04/11/2013  . Other and unspecified hyperlipidemia 04/11/2013  . Normocytic anemia 04/11/2013  . Hypotension, unspecified 04/11/2013  . Mitral regurgitation 03/11/2013  . Angiodysplasia of stomach 02/25/2013  . GI bleed 02/24/2013  . Acute renal insufficiency 02/24/2013  . Warfarin-induced coagulopathy 02/24/2013  . Acute blood loss anemia 02/24/2013  . A-fib 02/06/2013  . Hypokalemia 02/06/2013  . High cholesterol   . Acute diastolic heart failure 02/05/2013   Past Medical History:  Past Medical History  Diagnosis Date  . Pancreatitis     pt not aware of this dx  . Hypertension   . Myocardial infarction   . Hypothyroid   . High cholesterol   . PAF (paroxysmal atrial fibrillation)     a. 01/2013 s/p TEE/DCCV (EF 65-70%, sev conc LVH, mod TR, no thrombus).  . Collagen vascular disease   . Diabetes mellitus without complication   . Nephrolithiasis     Bil. by CT of 2012  . PVD (peripheral vascular disease)   . Atrial flutter   . Diastolic CHF 04/11/2013   Past Surgical History:  Past Surgical History  Procedure Laterality Date  . Vascular surgery  1999    s/p mesenteric bypass.  in [facility].   Marland Kitchen Appendectomy    . Tee without cardioversion N/A 02/04/2013    Procedure: TRANSESOPHAGEAL ECHOCARDIOGRAM (TEE);  Surgeon: Vesta Mixer, MD;  Location: [facility] ENDOSCOPY;  Service: Cardiovascular;  Laterality: N/A;  . Cardioversion N/A 02/04/2013    Procedure: CARDIOVERSION;  Surgeon: Vesta Mixer, MD;  Location: [facility] ENDOSCOPY;  Service: Cardiovascular;   Laterality: N/A;  . Partial colectomy  1999    had this following mesenteric bypass for what sounds like ischemic colitis.   Marland Kitchen Splenectomy  1999    pt recalls this was done at time of colectomy.  . Esophagogastroduodenoscopy N/A 02/25/2013    Procedure: ESOPHAGOGASTRODUODENOSCOPY (EGD);  Surgeon: Beverley Fiedler, MD;  Location: [facility] ENDOSCOPY;  Service: Endoscopy;  Laterality: N/A;  . Argon laser application  02/25/2013    Procedure: ARGON LASER APPLICATION;  Surgeon: Beverley Fiedler, MD;  Location: [facility] ENDOSCOPY;  Service: Endoscopy;;  . Rhae Hammock without cardioversion N/A 03/02/2013    Procedure: TRANSESOPHAGEAL ECHOCARDIOGRAM (TEE);  Surgeon: Lewayne Bunting, MD;  Location: [facility] ENDOSCOPY;  Service: Cardiovascular;  Laterality: N/A;  . Cardioversion N/A 03/02/2013    Procedure: CARDIOVERSION;  Surgeon: Lewayne Bunting, MD;  Location: [facility] ENDOSCOPY;  Service: Cardiovascular;  Laterality: N/A;   HPI:  pt is an 79 yo female adm to [facility] with diplopia, dizziness.  MRI 04/12/13 Sub cm acute infarction within the left cerebellum. Old small vessel disease.  Pt resides alone and reports having two supportive sons.  Speech evaluation was ordered.    Assessment / Plan / Recommendation Clinical Impression  Pt presents with functional cognitive linguistic skills  - no dysarthria nor language deficits.  Pt able to follow multi step commands, name 10 animals in 60 seconds and write paragraph indicating intact syntax, semantics and use.  Pt was oriented x4 and self reports no changes with memory, attention, etc.  She demonstrated great divided attention by conducing speech/language task  as well as participating in evaluation by nursing student.  Pt named her home medications without any cues.    Discussed symptoms of CVA using acronym "FAST" (in addition to other symptoms) using teach back for comprehension.  SLP provided pt with strategies to assure she adequately manages home duties including appts, medicine, finances, etc.   Advised pt to have someone to check to make sure tasks managed given she lives alone.  No further SLP indicated, thanks for this consult.      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  Overall Cognitive Status: Within Functional Limits for tasks assessed Arousal/Alertness: Awake/alert Orientation Level: Oriented X4 Attention: Divided;Alternating;Selective Sustained Attention: Appears intact Selective Attention: Appears intact Alternating Attention: Appears intact Divided Attention: Appears intact Memory: Appears intact Awareness: Appears intact Problem Solving: Appears intact    Comprehension  Auditory Comprehension Overall Auditory Comprehension: Appears within functional limits for tasks assessed Yes/No Questions: Not tested Commands: Within Functional Limits Conversation: Complex Visual Recognition/Discrimination Discrimination: Not tested Reading Comprehension Reading Status: Not tested (pt has reading glasses that were left at home)    Expression Expression Primary Mode of Expression: Verbal Verbal Expression Overall Verbal Expression: Appears within functional limits for tasks assessed Initiation: No impairment Repetition: No impairment Naming: No impairment Pragmatics: No impairment Written Expression Dominant Hand: Right Written Expression: Within Functional Limits   Oral / Motor Oral Motor/Sensory Function Overall Oral Motor/Sensory Function: Appears within functional limits for tasks assessed Motor Speech Overall Motor Speech: Appears within functional limits for tasks assessed (pt reports  speech to be baseline) Resonance: Within functional limits Articulation: Within functional limitis Motor Planning: Witnin functional limits Motor Speech Errors: Not applicable   GO     Mills Koller, MS [facility] SLP [phone]

## 2013-04-13 NOTE — Progress Notes (Signed)
Pt's son requested for tab tramadol ( home medication) for pt,said to be slightly restless and agitated, NP Lenny Pastel (on call) paged and notified,ordered to have Tramadol 50mg  every 6hrs, same commenced at 2131,pt reassured,will continue to monitor. Obasogie-Asidi, Phuc Kluttz Efe

## 2013-04-13 NOTE — Discharge Summary (Addendum)
Physician Discharge Summary  Sydney Quinn WJX:[id number] DOB: May 30, 1929 DOA: 04/11/2013  PCP: Verlon Au, MD  Admit date: 04/11/2013 Discharge date: 04/13/2013  Time spent: > 35 minutes  Recommendations for Outpatient Follow-up:  1. Please be sure to follow up with your primary care physician in 1-2 weeks or sooner 2. Follow up with neurologist in 1-2 months 3. F/u with Cardiologist  Discharge Diagnoses:  Active Problems:   A-fib   Hypokalemia   GI bleed   Angiodysplasia of stomach   Mitral regurgitation   Diastolic CHF   Other and unspecified hyperlipidemia   Normocytic anemia   Hypotension, unspecified   Unspecified protein-calorie malnutrition   Unspecified hypothyroidism   L cerebellar embolic infarct   Discharge Condition: stable  Diet recommendation: Low sodium/heart healthy  Filed Weights   04/11/13 2104  Weight: 48.1 kg (106 lb 0.7 oz)    History of present illness:  From stroke team PN HISTORY  Sydney Quinn is an 79 y.o. female with a history of atrial fibrillation who presented with sudden onset vertigo on 04/11/2013. She got up in her normal state, but subsequently around 9:30 that morning began having a spinning sensation then became unsteady. She also developed diplopia at that time. She stated that she did not remember clearly whether she was able to walk but stated that she felt that she needed a wheelchair to get into the ED.  She stated that the symptoms had improved greatly, but she still had some mild symptoms. She no longer had any diplopia.  Hospital Course:   CVA  -Neurology evaluated and recommended the following:  Continue Eliquis 2.5 mg bid for secondary stroke prevention. Long discussion with the patient about lack of evidence suggesting switching to Xarelto or Pradaxa may be superior or not. She was advised to discuss this further with her cardiologist Dr. Jens Som at upcoming visit this Wednesday.  OP OT  Continue zocor at  discharge Patient has a 10-15% risk of having another stroke over the next year, the highest risk is within 2 weeks of the most recent stroke/TIA (risk of having a stroke following a stroke or TIA is the same).  Ongoing risk factor control by Primary Care Physician  Stroke Service will sign off. Please call should any needs arise.  Follow up with Dr. Pearlean Brownie, Stroke Clinic, in 2 months.  Vertigo-  -MRI/MRA of Brain; see results below  - 2-D ECHO and Carotid Doppler results pending  - Check Orthostatics daily (negative orthostatic hypotension 11/16)  - PT and OT evaluated. PT recommended no Equipment and no pt follow up.  Hypotension  - resolved. Will place back on metoprolol XL at lower dose 12.5 mg po daily for improved rate control  Atrial Fibrillation  - On Amiodarone, toprol and Eliquis Rx.   Diastolic CHF  - Chronic, Monitor for S/Sx of Fluid Overload.  -cardiogram results pending   Hyperlipidemia  - Continue simvastatin 40 mg daily  -Within ADA guidelines   Normocytic Anemia  Anemia Panel, pending.   Hypokalemia  -Will replete prior to discharge  Hypothyroidism  -Obtain TSH/free T4  -Continue Synthroid daily   Malnutrition  -Obtain nutrition consult Addendum: who reviewed case. Patient meets criteria for severe malnutrition in the context of chronic illness as evidenced by < 75% intake of estimated energy requirement for > 1 month, severe muscle loss (clavicles, acromion bone region) and reported 16% weight loss x 1 year. -Patient declined intervention per dietitian notes.  Procedures: CT Head 04/11/2013 1.  Atrophy and small vessel disease. 2. No evidence for acute intracranial abnormality.  MRI of the brain 04/12/2013 Sub cm acute infarction within the left cerebellum. Old small vessel cerebellar infarctions. Chronic small vessel disease throughout the  cerebral hemispheric white matter.  MRA of the brain 04/12/2013 Abnormal flow within the left vertebral  artery. This could be due to reduced antegrade flow or retrograde flow.  2D Echocardiogram EF 45-50%. Pt in atrial fibrillation  Carotid Doppler There is significant plaque noted in the bilateral CCAs, ICAs, and ECAs. 1-39% ICA stenosis. Right vertebral artery flow is antegrade. Left vertebral artery flow is retrograde.  EKG atrial fibrillation - ventricular response 89 beats per minute   Consultations:  Neurology: Dr. Pearlean Brownie  Discharge Exam: Filed Vitals:   04/13/13 1407  BP: 106/80  Pulse: 103  Temp: 98.9 F (37.2 C)  Resp: 18    General: Pt in NAD, Alert and Awake Cardiovascular: irregularly irregular rate controlled on exam, no murmurs Respiratory: CTA BL, no wheezes  Discharge Instructions  Discharge Orders   Future Appointments Provider Department Dept Phone   04/15/2013 10:45 AM Lewayne Bunting, MD [facility] [phone]   Future Orders Complete By Expires   Call MD for:  difficulty breathing, headache or visual disturbances  As directed    Call MD for:  persistant dizziness or light-headedness  As directed    Call MD for:  temperature >100.4  As directed    Diet - low sodium heart healthy  As directed    Discharge instructions  As directed    Comments:     Home with intermittent help/observation from family.   Increase activity slowly  As directed        Medication List    STOP taking these medications       Echinacea 400 MG Caps     gabapentin 300 MG capsule  Commonly known as:  NEURONTIN            simvastatin 40 MG tablet  Commonly known as:  ZOCOR      TAKE these medications     metoprolol succinate 12.5 MG 24 hr tablet  Commonly known as:  TOPROL-XL      amiodarone 200 MG tablet  Commonly known as:  PACERONE  Take 1 tablet (200 mg total) by mouth daily.     apixaban 2.5 MG Tabs tablet  Commonly known as:  ELIQUIS  Take 1 tablet (2.5 mg total) by mouth 2 (two) times daily.     atorvastatin 20 MG tablet  Commonly known  as:  LIPITOR  Take 1 tablet (20 mg total) by mouth daily at 6 PM.     CALCIUM 600 PO  Take 600 mg by mouth 2 (two) times daily.     fish oil-omega-3 fatty acids 1000 MG capsule  Take 1 g by mouth daily.     Levothyroxine Sodium 88 MCG Caps  Take 88 mcg by mouth daily before breakfast.     traMADol 50 MG tablet  Commonly known as:  ULTRAM  Take 50 mg by mouth every 6 (six) hours as needed for pain.       Allergies  Allergen Reactions  . Biaxin [Clarithromycin] Rash       Follow-up Information   Follow up with Gates Rigg, MD. Schedule an appointment as soon as possible for a visit in 2 months. (stroke clinic)    Specialties:  Neurology, Radiology   Contact information:   [address]  [address] [place] [address] [phone]        The results of significant diagnostics from this hospitalization (including imaging, microbiology, ancillary and laboratory) are listed below for reference.    Significant Diagnostic Studies: Dg Chest 2 View  04/11/2013   CLINICAL DATA:  CVA.  EXAM: CHEST  2 VIEW  COMPARISON:  Chest radiograph performed 02/04/2013, and CTA of the chest from 02/05/2013  FINDINGS: The lungs are well expanded. Vascular congestion is noted. Mild right-sided and left basilar airspace opacities may reflect pneumonia or mild pulmonary edema. No definite pleural effusion or pneumothorax is seen.  The cardiomediastinal silhouette is mildly enlarged. No acute osseous abnormalities are identified.  IMPRESSION: Vascular congestion and mild cardiomegaly. Mild right-sided and left basilar airspace opacities may reflect pneumonia or mild pulmonary edema.   Electronically Signed   By: Roanna Raider M.D.   On: 04/11/2013 23:57   Ct Head Wo Contrast  04/11/2013   CLINICAL DATA:  Increased weakness in September. Vomiting this morning. History of hypertension, diabetes, and PVD.  EXAM: CT HEAD WITHOUT CONTRAST  TECHNIQUE: Contiguous axial images were obtained from  the base of the skull through the vertex without intravenous contrast.  COMPARISON:  None available  FINDINGS: There is central and cortical atrophy. Periventricular white matter changes are consistent with small vessel disease. There is no evidence for hemorrhage, mass lesion, or acute infarction.  Bone windows show no calvarial fracture. There is atherosclerotic calcification of the internal carotid arteries.  IMPRESSION: 1. Atrophy and small vessel disease. 2.  No evidence for acute intracranial abnormality.   Electronically Signed   By: Rosalie Gums M.D.   On: 04/11/2013 15:26   Mr Brain Wo Contrast  04/12/2013   CLINICAL DATA:  Sudden onset of dizziness and visual disturbance.  EXAM: MRI HEAD WITHOUT CONTRAST  MRA HEAD WITHOUT CONTRAST  TECHNIQUE: Multiplanar, multiecho pulse sequences of the brain and surrounding structures were obtained without intravenous contrast. Angiographic images of the head were obtained using MRA technique without contrast.  COMPARISON:  Head CT 04/11/2013.  MRI 10/14/2008.  FINDINGS: MRI HEAD FINDINGS  Diffusion imaging shows a 4 mm acute infarction within the central left cerebellum. No other acute infarction. There are mild chronic small-vessel changes affecting the pons. There are a few old small vessel cerebellar infarctions. The cerebral hemispheres show age related atrophy with chronic small-vessel changes affecting the hemispheric white matter. No cortical or large vessel territory infarction. No mass lesion, hemorrhage, hydrocephalus or extra-axial collection. No pituitary abnormality. Sinuses are clear.  MRA HEAD FINDINGS  Both internal carotid arteries are widely patent into the brain. The anterior and middle cerebral vessels are patent without proximal stenosis, aneurysm or vascular malformation.  The dominant right vertebral artery shows normal flow to the basilar. Flow is demonstrated in the left vertebral artery, but this flow does not appear normal. This could be  reduced or retrograde. No basilar stenosis. Posterior circulation branch vessels are patent. Large posterior communicating artery on the left.  IMPRESSION: Sub cm acute infarction within the left cerebellum. Old small vessel cerebellar infarctions. Chronic small vessel disease throughout the cerebral hemispheric white matter.  Abnormal flow within the left vertebral artery. This could be due to reduced antegrade flow or retrograde flow.   Electronically Signed   By: Paulina Fusi M.D.   On: 04/12/2013 09:52   Mr Maxine Glenn Head/brain Wo Cm  04/12/2013   CLINICAL DATA:  Sudden onset of dizziness and visual disturbance.  EXAM: MRI HEAD WITHOUT  CONTRAST  MRA HEAD WITHOUT CONTRAST  TECHNIQUE: Multiplanar, multiecho pulse sequences of the brain and surrounding structures were obtained without intravenous contrast. Angiographic images of the head were obtained using MRA technique without contrast.  COMPARISON:  Head CT 04/11/2013.  MRI 10/14/2008.  FINDINGS: MRI HEAD FINDINGS  Diffusion imaging shows a 4 mm acute infarction within the central left cerebellum. No other acute infarction. There are mild chronic small-vessel changes affecting the pons. There are a few old small vessel cerebellar infarctions. The cerebral hemispheres show age related atrophy with chronic small-vessel changes affecting the hemispheric white matter. No cortical or large vessel territory infarction. No mass lesion, hemorrhage, hydrocephalus or extra-axial collection. No pituitary abnormality. Sinuses are clear.  MRA HEAD FINDINGS  Both internal carotid arteries are widely patent into the brain. The anterior and middle cerebral vessels are patent without proximal stenosis, aneurysm or vascular malformation.  The dominant right vertebral artery shows normal flow to the basilar. Flow is demonstrated in the left vertebral artery, but this flow does not appear normal. This could be reduced or retrograde. No basilar stenosis. Posterior circulation branch  vessels are patent. Large posterior communicating artery on the left.  IMPRESSION: Sub cm acute infarction within the left cerebellum. Old small vessel cerebellar infarctions. Chronic small vessel disease throughout the cerebral hemispheric white matter.  Abnormal flow within the left vertebral artery. This could be due to reduced antegrade flow or retrograde flow.   Electronically Signed   By: Paulina Fusi M.D.   On: 04/12/2013 09:52    Microbiology: Recent Results (from the past 240 hour(s))  URINE CULTURE     Status: None   Collection Time    04/11/13  3:38 PM      Result Value Range Status   Specimen Description URINE, CLEAN CATCH   Final   Special Requests NONE   Final   Culture  Setup Time     Final   Value: 04/11/2013 19:41     Performed at [facility]   Culture     Final   Value: INSIGNIFICANT GROWTH     Performed at [facility]   Report Status 04/12/2013 FINAL   Final     Labs: Basic Metabolic Panel:  Recent Labs Lab 04/11/13 1405 04/13/13 0505  NA 141 143  K 3.3* 3.3*  CL 100 105  CO2 28 28  GLUCOSE 162* 108*  BUN 17 12  CREATININE 1.20* 1.13*  CALCIUM 9.6 8.8  MG  --  1.9   Liver Function Tests:  Recent Labs Lab 04/12/13 1330 04/13/13 0505  AST  --  22  ALT  --  24  ALKPHOS  --  85  BILITOT 0.9 0.8  PROT  --  6.7  ALBUMIN 3.3* 2.9*   No results found for this basename: LIPASE, AMYLASE,  in the last 168 hours No results found for this basename: AMMONIA,  in the last 168 hours CBC:  Recent Labs Lab 04/11/13 1405 04/13/13 0505  WBC 6.8 7.8  NEUTROABS 5.8 5.8  HGB 9.6* 9.6*  HCT 30.8* 29.6*  MCV 91.9 89.4  PLT 232 246   Cardiac Enzymes:  Recent Labs Lab 04/11/13 1405  TROPONINI <0.30   BNP: BNP (last 3 results) No results found for this basename: PROBNP,  in the last 8760 hours CBG: No results found for this basename: GLUCAP,  in the last 168 hours     Signed:  Penny Pia  [facility] 04/13/2013,  3:14 PM

## 2013-04-13 NOTE — Progress Notes (Addendum)
INITIAL NUTRITION ASSESSMENT  DOCUMENTATION CODES Per approved criteria  -Severe malnutrition in the context of chronic illness -Underweight   INTERVENTION: No nutrition intervention at this time ---> patient declined RD to follow for nutrition care plan  NUTRITION DIAGNOSIS: Increased nutrient needs related to malnutrition, repletion as evidenced by estimated nutrition needs  Goal: Pt to meet >/= 90% of their estimated nutrition needs   Monitor:  PO & supplemental intake, weight, labs, I/O's  Reason for Assessment: Consult  79 y.o. female  Admitting Dx: Vertigo  ASSESSMENT: Patient with PMH of pancreatitis, HTN, myocardial infarction, DM and diastolic CHF presented to ED with complaints of sudden onset of dizziness along with double vision; CT scan was performed and was found to be negative for acute findings, she was transfered to [facility] for further evaluation to rule out a posterior circulation CVA.   Patient reports she's had a poor appetite for the past 3 years since her husband passed away; she also reports a 20 lb weight loss in the past year; patient with visible severe muscle wasting to upper body; PO intake 75% for breakfast this AM; declined addition of nutrition supplements; hopes to go home today.  Patient meets criteria for severe malnutrition in the context of chronic illness as evidenced by < 75% intake of estimated energy requirement for > 1 month, severe muscle loss (clavicles, acromion bone region) and reported 16% weight loss x 1 year.  Height: Ht Readings from Last 1 Encounters:  04/11/13 5\' 4"  (1.626 m)    Weight: Wt Readings from Last 1 Encounters:  04/11/13 106 lb 0.7 oz (48.1 kg)    Ideal Body Weight: 120 lb  % Ideal Body Weight: 88%  Wt Readings from Last 20 Encounters:  04/11/13 106 lb 0.7 oz (48.1 kg)  03/11/13 106 lb (48.081 kg)  03/03/13 114 lb 11.2 oz (52.028 kg)  03/03/13 114 lb 11.2 oz (52.028 kg)  03/03/13 114 lb 11.2 oz (52.028 kg)   02/02/13 108 lb 14.5 oz (49.4 kg)  02/02/13 108 lb 14.5 oz (49.4 kg)    Usual Body Weight: ~ 126 lb  % Usual Body Weight: 84%  BMI:  Body mass index is 18.19 kg/(m^2).  Estimated Nutritional Needs: Kcal: 1300-1500 Protein: 65-75 gm Fluid: >/= 1.5 L  Skin: Intact  Diet Order: Cardiac  EDUCATION NEEDS: -No education needs identified at this time   Intake/Output Summary (Last 24 hours) at 04/13/13 1056 Last data filed at 04/13/13 0900  Gross per 24 hour  Intake    240 ml  Output      0 ml  Net    240 ml    Labs:   Recent Labs Lab 04/11/13 1405 04/13/13 0505  NA 141 143  K 3.3* 3.3*  CL 100 105  CO2 28 28  BUN 17 12  CREATININE 1.20* 1.13*  CALCIUM 9.6 8.8  MG  --  1.9  GLUCOSE 162* 108*    Scheduled Meds: . amiodarone  200 mg Oral Daily  . apixaban  2.5 mg Oral BID  . atorvastatin  20 mg Oral q1800  . ondansetron (ZOFRAN) IV  4 mg Intravenous Once    Continuous Infusions:   Past Medical History  Diagnosis Date  . Pancreatitis     pt not aware of this dx  . Hypertension   . Myocardial infarction   . Hypothyroid   . High cholesterol   . PAF (paroxysmal atrial fibrillation)     a. 01/2013 s/p TEE/DCCV (EF 65-70%,  sev conc LVH, mod TR, no thrombus).  . Collagen vascular disease   . Diabetes mellitus without complication   . Nephrolithiasis     Bil. by CT of 2012  . PVD (peripheral vascular disease)   . Atrial flutter   . Diastolic CHF 04/11/2013  . L cerebellar embolic infarct 96/08/5407    Past Surgical History  Procedure Laterality Date  . Vascular surgery  1999    s/p mesenteric bypass.  in [facility].   Marland Kitchen Appendectomy    . Tee without cardioversion N/A 02/04/2013    Procedure: TRANSESOPHAGEAL ECHOCARDIOGRAM (TEE);  Surgeon: Vesta Mixer, MD;  Location: [facility] ENDOSCOPY;  Service: Cardiovascular;  Laterality: N/A;  . Cardioversion N/A 02/04/2013    Procedure: CARDIOVERSION;  Surgeon: Vesta Mixer, MD;  Location: [facility] ENDOSCOPY;   Service: Cardiovascular;  Laterality: N/A;  . Partial colectomy  1999    had this following mesenteric bypass for what sounds like ischemic colitis.   Marland Kitchen Splenectomy  1999    pt recalls this was done at time of colectomy.  . Esophagogastroduodenoscopy N/A 02/25/2013    Procedure: ESOPHAGOGASTRODUODENOSCOPY (EGD);  Surgeon: Beverley Fiedler, MD;  Location: [facility] ENDOSCOPY;  Service: Endoscopy;  Laterality: N/A;  . Argon laser application  02/25/2013    Procedure: ARGON LASER APPLICATION;  Surgeon: Beverley Fiedler, MD;  Location: [facility] ENDOSCOPY;  Service: Endoscopy;;  . Rhae Hammock without cardioversion N/A 03/02/2013    Procedure: TRANSESOPHAGEAL ECHOCARDIOGRAM (TEE);  Surgeon: Lewayne Bunting, MD;  Location: [facility] ENDOSCOPY;  Service: Cardiovascular;  Laterality: N/A;  . Cardioversion N/A 03/02/2013    Procedure: CARDIOVERSION;  Surgeon: Lewayne Bunting, MD;  Location: [facility] ENDOSCOPY;  Service: Cardiovascular;  Laterality: N/A;    Maureen Chatters, RD, LDN Pager #: [phone] After-Hours Pager #: [phone]

## 2013-04-14 ENCOUNTER — Telehealth: Payer: Self-pay | Admitting: Cardiology

## 2013-04-14 NOTE — Telephone Encounter (Signed)
New message  Patients son would like to speak with you today prior to his mother appointment. She had a stroke on Saturday. He feels you to need be aware of some things. Please call & advise.

## 2013-04-14 NOTE — Telephone Encounter (Signed)
Spoke with pt son, he wanted to make sure dr Jens Som was aware of the recent discharge from [facility] with a stroke. Pt made aware we have access to all of those records and will make sure dr Jens Som looks at those tomorrow.

## 2013-04-15 ENCOUNTER — Ambulatory Visit (INDEPENDENT_AMBULATORY_CARE_PROVIDER_SITE_OTHER): Payer: Medicare Other | Admitting: Cardiology

## 2013-04-15 ENCOUNTER — Encounter: Payer: Self-pay | Admitting: Cardiology

## 2013-04-15 VITALS — BP 100/76 | HR 82 | Wt 101.0 lb

## 2013-04-15 DIAGNOSIS — I4891 Unspecified atrial fibrillation: Secondary | ICD-10-CM

## 2013-04-15 DIAGNOSIS — I059 Rheumatic mitral valve disease, unspecified: Secondary | ICD-10-CM

## 2013-04-15 DIAGNOSIS — I340 Nonrheumatic mitral (valve) insufficiency: Secondary | ICD-10-CM

## 2013-04-15 MED ORDER — METOPROLOL SUCCINATE 12.5 MG HALF TABLET: 12.5000 mg | ORAL_TABLET | Freq: Every day | ORAL | Status: DC

## 2013-04-15 MED ORDER — ATORVASTATIN CALCIUM 20 MG PO TABS: 20.0000 mg | ORAL_TABLET | Freq: Every day | ORAL | Status: DC

## 2013-04-15 MED ORDER — METOPROLOL SUCCINATE ER 25 MG PO TB24: 12.5000 mg | ORAL_TABLET | Freq: Every day | ORAL | Status: DC

## 2013-04-15 MED ORDER — AMIODARONE HCL 200 MG PO TABS: 200.0000 mg | ORAL_TABLET | Freq: Every day | ORAL | Status: AC

## 2013-04-15 NOTE — Assessment & Plan Note (Addendum)
Patient remains in atrial fibrillation on examination. She had a recent presumed embolic CVA. Continue apixaban. Continue amiodarone and low-dose Toprol for rate control. She is complaining of fatigue. Certainly atrial fibrillation could be contributing. She had attempts at cardioversion twice on amiodarone. She did not hold sinus rhythm. I have elected rate control and anticoagulation. Schedule 21 day monitor to make sure that her rate is adequately controlled. It would be difficult to increase AV nodal blocking agents as her blood pressure is borderline. We could consider adding digoxin versus referral for AV node ablation and pacemaker if her rate is elevated. I will plan to repeat chest x-ray, TSH and liver functions when she returns in 8 weeks.

## 2013-04-15 NOTE — Progress Notes (Signed)
HPI: fu atrial fibrillation. Transesophageal echocardiogram on February 04, 2013 showed no thrombus. Patient underwent cardioversion on February 04, 2013. She was treated with Lovenox/Coumadin and aspirin following the procedure. Readmitted with GI bleed. Coumadin and aspirin discontinued. 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 She was treated with transfusion as well. She was noted to be in recurrent atrial flutter/fibrillation. Therefore TEE cardioversion was repeated after resuming apixaban; note the transesophageal echocardiogram showed an ejection fraction of 55-60%, moderate mitral regurgitation, mild aortic insufficiency and biatrial enlargement. There was moderate tricuspid regurgitation. She was then discharged. I last saw her in October of 2014. Atrial fibrillation recurred and we elected to try rate control and anticoagulation. Patient admitted in November of 2014 with vertigo and diplopia. 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 Echocardiogram in November of 2014 showed an ejection fraction of 45-50%, mild aortic insufficiency, mild to moderate mitral stenosis, mild mitral regurgitation, mild biatrial enlargement and severe tricuspid regurgitation. Since she was discharged she has fatigue. She denies dyspnea, chest pain, palpitations, bleeding or syncope.   Current Outpatient Prescriptions  Medication Sig Dispense Refill  . amiodarone (PACERONE) 200 MG tablet Take 1 tablet (200 mg total) by mouth daily.  90 tablet  3  . apixaban (ELIQUIS) 2.5 MG TABS tablet Take 1 tablet (2.5 mg total) by mouth 2 (two) times daily.  60 tablet  11  . atorvastatin (LIPITOR) 20 MG tablet Take 1 tablet (20 mg total) by mouth daily at 6 PM.  30 tablet  0  . Calcium Carbonate (CALCIUM 600 PO) Take 600 mg by mouth 2 (two) times daily.       . fish oil-omega-3 fatty acids 1000 MG capsule Take  1 g by mouth daily.      . Levothyroxine Sodium 88 MCG CAPS Take 88 mcg by mouth daily before breakfast.       . metoprolol succinate (TOPROL-XL) 12.5 mg TB24 24 hr tablet Take 0.5 tablets (12.5 mg total) by mouth daily.  30 each  0  . traMADol (ULTRAM) 50 MG tablet Take 50 mg by mouth every 6 (six) hours as needed for pain.       No current facility-administered medications for this visit.     Past Medical History  Diagnosis Date  . Pancreatitis     pt not aware of this dx  . Hypertension   . Myocardial infarction   . Hypothyroid   . High cholesterol   . PAF (paroxysmal atrial fibrillation)     a. 01/2013 s/p TEE/DCCV (EF 65-70%, sev conc LVH, mod TR, no thrombus).  . Collagen vascular disease   . Diabetes mellitus without complication   . Nephrolithiasis     Bil. by CT of 2012  . PVD (peripheral vascular disease)   . Atrial flutter   . Diastolic CHF 04/11/2013  . 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 Vascular surgery  1999    s/p mesenteric bypass.  in [facility].   Marland Kitchen Appendectomy    . Tee without cardioversion N/A 02/04/2013    Procedure: TRANSESOPHAGEAL ECHOCARDIOGRAM (TEE);  Surgeon: Vesta Mixer, MD;  Location: [facility] ENDOSCOPY;  Service: Cardiovascular;  Laterality: N/A;  . Cardioversion N/A 02/04/2013    Procedure: CARDIOVERSION;  Surgeon: Vesta Mixer, MD;  Location: [facility] ENDOSCOPY;  Service: Cardiovascular;  Laterality: N/A;  . Partial  colectomy  1999    had this following mesenteric bypass for what sounds like ischemic colitis.   Marland Kitchen Splenectomy  1999    pt recalls this was done at time of colectomy.  . Esophagogastroduodenoscopy N/A 02/25/2013    Procedure: ESOPHAGOGASTRODUODENOSCOPY (EGD);  Surgeon: Beverley Fiedler, MD;  Location: [facility] ENDOSCOPY;  Service: Endoscopy;  Laterality: N/A;  . Argon laser application  02/25/2013    Procedure: ARGON LASER APPLICATION;  Surgeon: Beverley Fiedler, MD;  Location: [facility] ENDOSCOPY;   Service: Endoscopy;;  . Rhae Hammock without cardioversion N/A 03/02/2013    Procedure: TRANSESOPHAGEAL ECHOCARDIOGRAM (TEE);  Surgeon: Lewayne Bunting, MD;  Location: [facility] ENDOSCOPY;  Service: Cardiovascular;  Laterality: N/A;  . Cardioversion N/A 03/02/2013    Procedure: CARDIOVERSION;  Surgeon: Lewayne Bunting, MD;  Location: [facility] ENDOSCOPY;  Service: Cardiovascular;  Laterality: N/A;    History   Social History  . Marital Status: Widowed    Spouse Name: N/A    Number of Children: 2  . Years of Education: N/A   Occupational History  . Not on file.   Social History Main Topics  . Smoking status: Former Smoker -- 0.25 packs/day    Quit date: 02/16/2013  . Smokeless tobacco: Not on file  . Alcohol Use: No  . Drug Use: No  . Sexual Activity: Not on file   Other Topics Concern  . Not on file   Social History Narrative  . No narrative on file    ROS: no fevers or chills, productive cough, hemoptysis, dysphasia, odynophagia, melena, hematochezia, dysuria, hematuria, rash, seizure activity, orthopnea, PND, pedal edema, claudication. Remaining systems are negative.  Physical Exam: Well-developed frail in no acute distress.  Skin is warm and dry.  HEENT is normal.  Neck is supple.  Chest is clear to auscultation with normal expansion.  Cardiovascular exam is irregular Abdominal exam nontender or distended. No masses palpated. Extremities show no edema. neuro grossly intact

## 2013-04-15 NOTE — Assessment & Plan Note (Signed)
Mild on most recent echo. 

## 2013-04-15 NOTE — Patient Instructions (Signed)
Your physician recommends that you schedule a follow-up appointment in: 6 WEEKS WITH DR CRENSHAW IN [place]  Your physician has recommended that you wear an event monitor. Event monitors are medical devices that record the heart's electrical activity. Doctors most often [place] these monitors to diagnose arrhythmias. Arrhythmias are problems with the speed or rhythm of the heartbeat. The monitor is a small, portable device. You can wear one while you do your normal daily activities. This is usually used to diagnose what is causing palpitations/syncope (passing out). WILL BE MAILED TO PATIENTS HOME.

## 2013-04-22 ENCOUNTER — Encounter: Payer: Self-pay | Admitting: *Deleted

## 2013-04-22 ENCOUNTER — Telehealth: Payer: Self-pay | Admitting: Cardiology

## 2013-04-22 NOTE — Telephone Encounter (Signed)
New problem'     Pt's son calling about the heart monitor. As of 11/25 they still have not received it he will check again later today.   Please give him a call when you can.

## 2013-04-22 NOTE — Telephone Encounter (Signed)
Will forward to the monitor tech. 

## 2013-04-22 NOTE — Progress Notes (Signed)
Patient ID: Sydney Quinn, female   DOB: 06-07-1929, 79 y.o.   MRN: [id number] Patient enrolled for a cardiac event monitor to be mailed to patients home by [facility].

## 2013-04-27 ENCOUNTER — Telehealth: Payer: Self-pay | Admitting: Cardiology

## 2013-04-27 DIAGNOSIS — I4891 Unspecified atrial fibrillation: Secondary | ICD-10-CM

## 2013-04-27 DIAGNOSIS — I340 Nonrheumatic mitral (valve) insufficiency: Secondary | ICD-10-CM

## 2013-04-27 MED ORDER — METOPROLOL SUCCINATE ER 25 MG PO TB24: 12.5000 mg | ORAL_TABLET | Freq: Every day | ORAL | Status: AC

## 2013-04-27 NOTE — Telephone Encounter (Signed)
Will fax as requested

## 2013-04-27 NOTE — Telephone Encounter (Signed)
New Problem  Pt son called-- States that [employer] is in need of additional information on mMtoprolol// they are requesting the exact dosage and confirmation that it was prescribed by her cardiologist// Please confirm with [employer] at [phone]// contact rep's name is Princess or any on duty nurse // Please call son back to confirm that this information has been relayed// Pt's admittance is being delayed until this information is received by their administration// please assist.

## 2013-04-27 NOTE — Telephone Encounter (Signed)
New message    Have presc for metoprolol by Dr Jens Som but it is not signed.  Need a signed presc--pls fax to [phone]

## 2013-04-27 NOTE — Telephone Encounter (Signed)
Spoke with Princess and faxed over Rx for Metoprolol Suc. 25 mg 1/2 tablet daily. Left message on sons voicemail this has been done.

## 2013-04-27 NOTE — Telephone Encounter (Signed)
Left message for facility to call back

## 2013-04-29 ENCOUNTER — Telehealth: Payer: Self-pay | Admitting: *Deleted

## 2013-04-29 NOTE — Telephone Encounter (Signed)
pts dtr-n-law called to let us know the pt has been moved to assisted living facility in [place]. They are transferring her care to someone in [place]. The number given to medical records for transfer of records. Will make dr Jens Som aware

## 2013-06-10 ENCOUNTER — Ambulatory Visit: Payer: Medicare Other | Admitting: Cardiology

## 2014-03-08 ENCOUNTER — Other Ambulatory Visit: Payer: Self-pay | Admitting: Cardiology

## 2014-04-01 ENCOUNTER — Other Ambulatory Visit: Payer: Self-pay | Admitting: Cardiology

## 2014-04-08 ENCOUNTER — Telehealth: Payer: Self-pay

## 2014-04-08 NOTE — Telephone Encounter (Signed)
Prior authorization for Eliquis 2.5 mg faxed to patient's insurance company. Awaiting approval.

## 2014-04-09 NOTE — Telephone Encounter (Signed)
Prior authorization for Eliquis 2.5 mg has been approved through 04/09/2015. Pharmacy notified. Patient notified.

## 2014-04-13 ENCOUNTER — Other Ambulatory Visit: Payer: Self-pay | Admitting: Cardiology

## 2014-04-14 NOTE — Telephone Encounter (Signed)
Rx refill sent to patient pharmacy   

## 2014-05-13 ENCOUNTER — Other Ambulatory Visit: Payer: Self-pay | Admitting: Cardiology

## 2015-03-29 DEATH — deceased

## 2015-07-27 IMAGING — CR DG CHEST 1V PORT
1 series · 1 of 1 positions shown · non-contrast
Comparison: None.

CLINICAL DATA: Atrial fibrillation.

PORTABLE CHEST - 1 VIEW

[view not recorded]
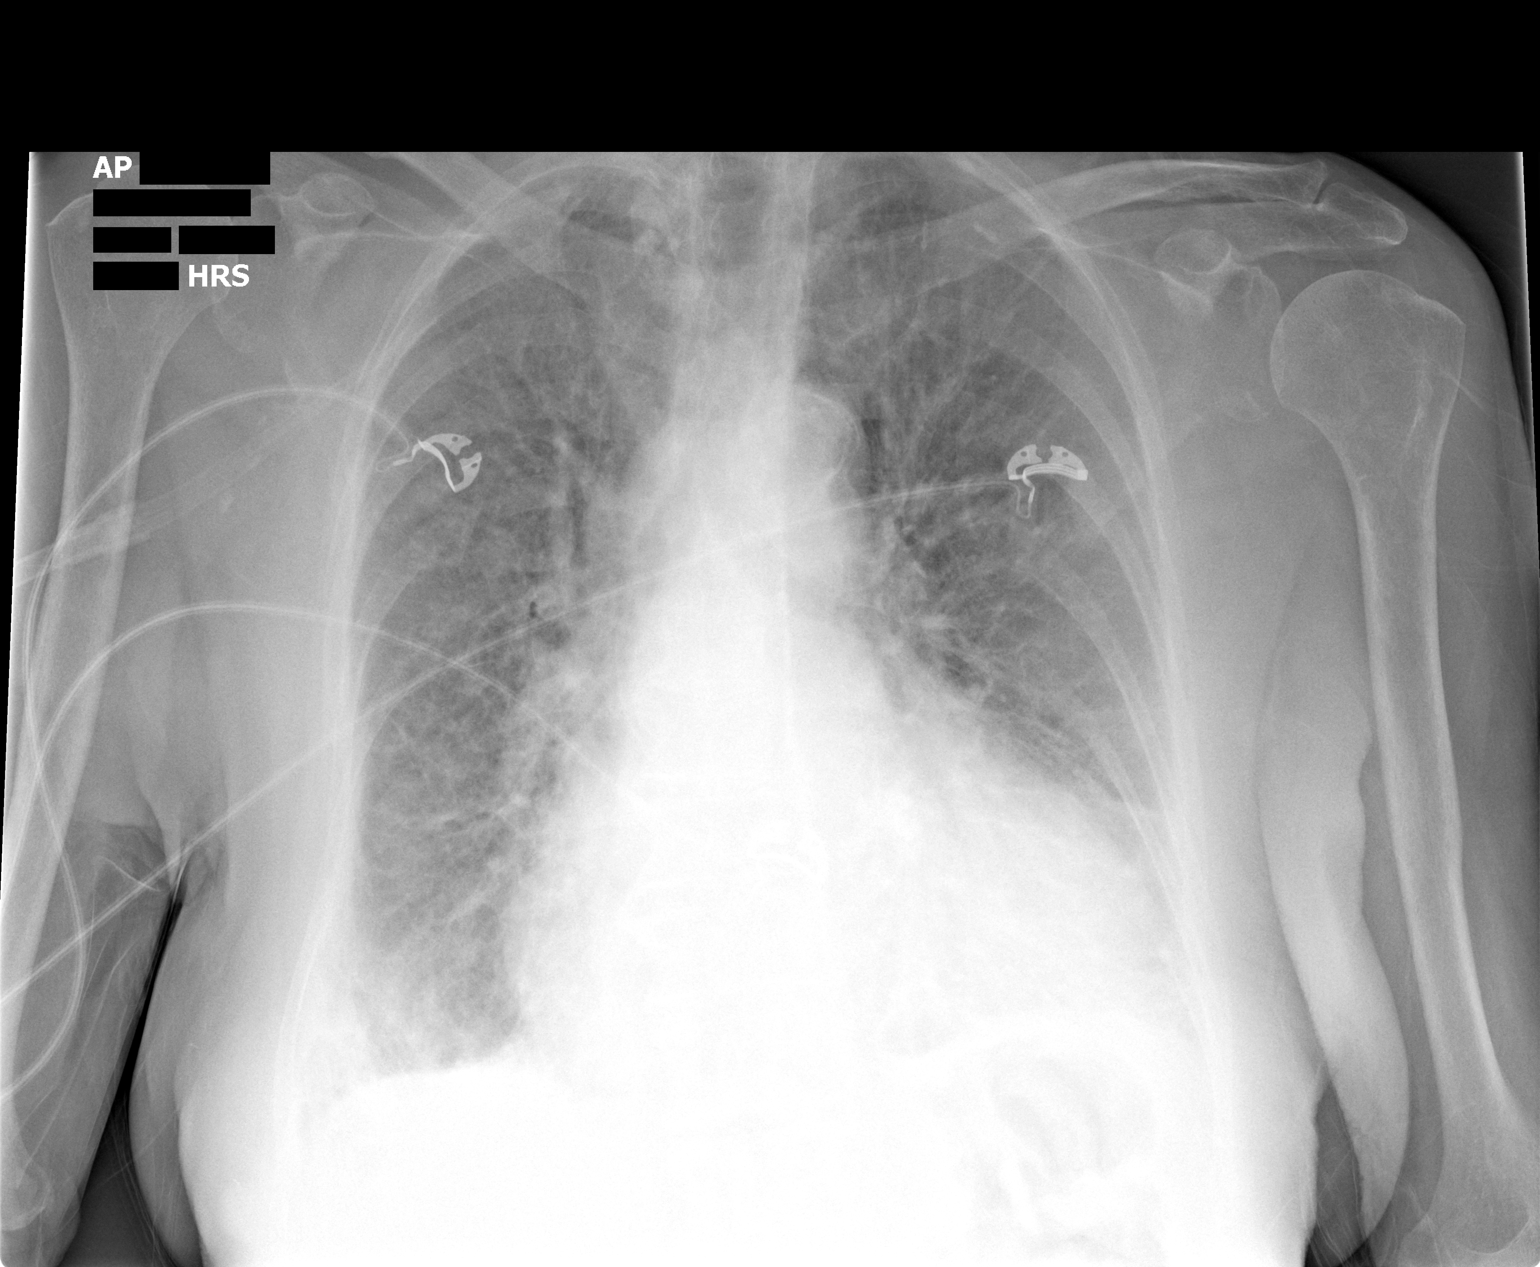

[1 of 1 positions shown; findings below may reference images not displayed]

FINDINGS: Single view of the chest demonstrates enlargement of the
cardiac silhouette.  Prominent lung markings, particularly at the
lung bases.  Trachea is midline.  Bony thorax is intact.  Negative
for a pneumothorax.
IMPRESSION: Cardiomegaly and evidence for interstitial pulmonary edema.
